# Patient Record
Sex: Female | Born: 1985 | Race: Black or African American | Hispanic: No | Marital: Single | Smoking: Never smoker
Health system: Southern US, Community
[De-identification: ages and names within clinical notes are randomized; demographics above are authoritative.]

## PROBLEM LIST (undated history)

## (undated) DIAGNOSIS — Z789 Other specified health status: Secondary | ICD-10-CM

## (undated) HISTORY — PX: WISDOM TOOTH EXTRACTION: SHX21

---
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
## 2010-04-22 ENCOUNTER — Inpatient Hospital Stay (HOSPITAL_COMMUNITY)
Admission: AD | Admit: 2010-04-22 | Discharge: 2010-04-22 | Payer: Self-pay | Source: Home / Self Care | Attending: Obstetrics and Gynecology | Admitting: Obstetrics and Gynecology

## 2010-04-27 LAB — GC/CHLAMYDIA PROBE AMP, GENITAL
Chlamydia, DNA Probe: NEGATIVE
GC Probe Amp, Genital: NEGATIVE

## 2010-04-27 LAB — WET PREP, GENITAL
Trich, Wet Prep: NONE SEEN
Yeast Wet Prep HPF POC: NONE SEEN

## 2010-04-27 LAB — URINALYSIS, ROUTINE W REFLEX MICROSCOPIC
Bilirubin Urine: NEGATIVE
Hgb urine dipstick: NEGATIVE
Ketones, ur: 15 mg/dL — AB
Nitrite: NEGATIVE
Protein, ur: NEGATIVE mg/dL
Specific Gravity, Urine: 1.015 (ref 1.005–1.030)
Urine Glucose, Fasting: NEGATIVE mg/dL
Urobilinogen, UA: 0.2 mg/dL (ref 0.0–1.0)
pH: 6 (ref 5.0–8.0)

## 2010-04-27 LAB — POCT PREGNANCY, URINE: Preg Test, Ur: POSITIVE

## 2010-04-27 LAB — HEMOGLOBIN AND HEMATOCRIT, BLOOD
HCT: 35.4 % — ABNORMAL LOW (ref 36.0–46.0)
Hemoglobin: 12.1 g/dL (ref 12.0–15.0)

## 2010-05-11 ENCOUNTER — Inpatient Hospital Stay (HOSPITAL_COMMUNITY)
Admission: AD | Admit: 2010-05-11 | Discharge: 2010-05-11 | Payer: Self-pay | Source: Home / Self Care | Attending: Obstetrics and Gynecology | Admitting: Obstetrics and Gynecology

## 2010-05-11 LAB — URINALYSIS, ROUTINE W REFLEX MICROSCOPIC
Bilirubin Urine: NEGATIVE
Hgb urine dipstick: NEGATIVE
Ketones, ur: 15 mg/dL — AB
Leukocytes, UA: NEGATIVE
Nitrite: POSITIVE — AB
Protein, ur: NEGATIVE mg/dL
Specific Gravity, Urine: 1.02 (ref 1.005–1.030)
Urine Glucose, Fasting: NEGATIVE mg/dL
Urobilinogen, UA: 0.2 mg/dL (ref 0.0–1.0)
pH: 6.5 (ref 5.0–8.0)

## 2010-05-11 LAB — URINE MICROSCOPIC-ADD ON

## 2010-09-22 ENCOUNTER — Inpatient Hospital Stay (HOSPITAL_COMMUNITY)
Admission: AD | Admit: 2010-09-22 | Discharge: 2010-09-27 | DRG: 775 | Disposition: A | Discharge status: Home / Self Care | Payer: Medicaid Other | Source: Ambulatory Visit | Attending: Obstetrics and Gynecology | Admitting: Obstetrics and Gynecology

## 2010-09-22 ENCOUNTER — Inpatient Hospital Stay (HOSPITAL_COMMUNITY): Payer: Medicaid Other

## 2010-09-22 LAB — URINALYSIS, ROUTINE W REFLEX MICROSCOPIC
Bilirubin Urine: NEGATIVE
Glucose, UA: NEGATIVE mg/dL
Ketones, ur: NEGATIVE mg/dL
Nitrite: NEGATIVE
Protein, ur: NEGATIVE mg/dL
Specific Gravity, Urine: <1.005 — ABNORMAL LOW (ref 1.005–1.030)
Urobilinogen, UA: 0.2 mg/dL (ref 0.0–1.0)
pH: 6.5 (ref 5.0–8.0)

## 2010-09-22 LAB — WET PREP, GENITAL
Clue Cells Wet Prep HPF POC: NONE SEEN
Trich, Wet Prep: NONE SEEN
Yeast Wet Prep HPF POC: NONE SEEN

## 2010-09-22 LAB — URINE MICROSCOPIC-ADD ON

## 2010-09-22 LAB — CBC
HCT: 30.4 % — ABNORMAL LOW (ref 36.0–46.0)
Hemoglobin: 10.2 g/dL — ABNORMAL LOW (ref 12.0–15.0)
MCH: 30 pg (ref 26.0–34.0)
MCHC: 33.6 g/dL (ref 30.0–36.0)
MCV: 89.4 fL (ref 78.0–100.0)
Platelets: 223 10*3/uL (ref 150–400)
RBC: 3.4 MIL/uL — ABNORMAL LOW (ref 3.87–5.11)
RDW: 13 % (ref 11.5–15.5)
WBC: 19.8 10*3/uL — ABNORMAL HIGH (ref 4.0–10.5)

## 2010-09-23 LAB — URINE CULTURE
Colony Count: 40000
Culture  Setup Time: 201206130119

## 2010-09-23 LAB — GC/CHLAMYDIA PROBE AMP, GENITAL
Chlamydia, DNA Probe: NEGATIVE
GC Probe Amp, Genital: NEGATIVE

## 2010-09-24 LAB — STREP B DNA PROBE: Strep Group B Ag: NEGATIVE

## 2010-09-25 ENCOUNTER — Other Ambulatory Visit: Payer: Self-pay | Admitting: Obstetrics and Gynecology

## 2010-09-25 LAB — CBC
HCT: 32.4 % — ABNORMAL LOW (ref 36.0–46.0)
Hemoglobin: 10.8 g/dL — ABNORMAL LOW (ref 12.0–15.0)
MCH: 29.7 pg (ref 26.0–34.0)
MCHC: 33.3 g/dL (ref 30.0–36.0)
MCV: 89 fL (ref 78.0–100.0)
Platelets: 227 10*3/uL (ref 150–400)
RBC: 3.64 MIL/uL — ABNORMAL LOW (ref 3.87–5.11)
RDW: 12.9 % (ref 11.5–15.5)
WBC: 27.7 10*3/uL — ABNORMAL HIGH (ref 4.0–10.5)

## 2010-09-26 LAB — CBC
HCT: 31.8 % — ABNORMAL LOW (ref 36.0–46.0)
Hemoglobin: 10.8 g/dL — ABNORMAL LOW (ref 12.0–15.0)
MCH: 30.3 pg (ref 26.0–34.0)
MCHC: 34 g/dL (ref 30.0–36.0)
MCV: 89.3 fL (ref 78.0–100.0)
Platelets: 241 10*3/uL (ref 150–400)
RBC: 3.56 MIL/uL — ABNORMAL LOW (ref 3.87–5.11)
RDW: 13 % (ref 11.5–15.5)
WBC: 21 10*3/uL — ABNORMAL HIGH (ref 4.0–10.5)

## 2011-07-14 ENCOUNTER — Ambulatory Visit: Payer: Self-pay | Admitting: Obstetrics and Gynecology

## 2011-07-19 ENCOUNTER — Other Ambulatory Visit: Payer: Self-pay | Admitting: Obstetrics and Gynecology

## 2011-07-19 ENCOUNTER — Ambulatory Visit (INDEPENDENT_AMBULATORY_CARE_PROVIDER_SITE_OTHER): Payer: Medicaid Other | Admitting: Obstetrics and Gynecology

## 2011-07-19 DIAGNOSIS — Z0000 Encounter for general adult medical examination without abnormal findings: Secondary | ICD-10-CM

## 2011-07-19 DIAGNOSIS — N926 Irregular menstruation, unspecified: Secondary | ICD-10-CM

## 2011-07-20 LAB — GC/CHLAMYDIA PROBE AMP, GENITAL
Chlamydia, DNA Probe: NEGATIVE
GC Probe Amp, Genital: NEGATIVE

## 2011-10-25 IMAGING — US US OB COMP +14 WK
1 series · 12 of 28 positions shown · non-contrast
Comparison: none

[Series 1: us ob comp +14 wk · 12 of 62 slices shown]
[im 3/62]
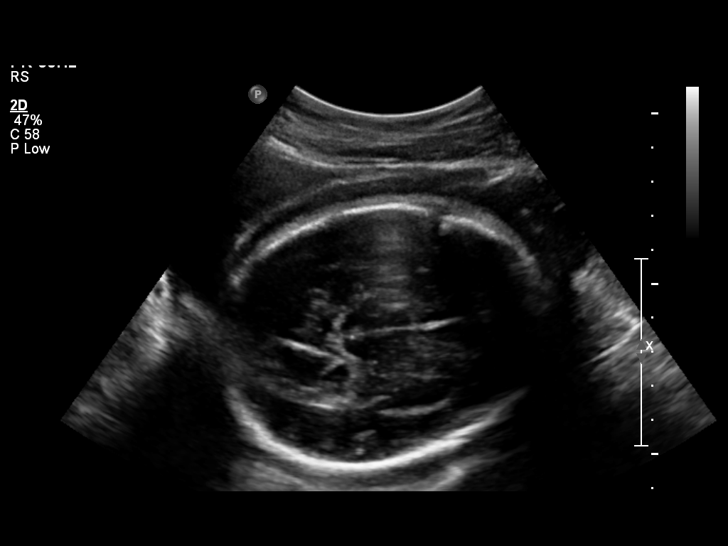
[im 7/62]
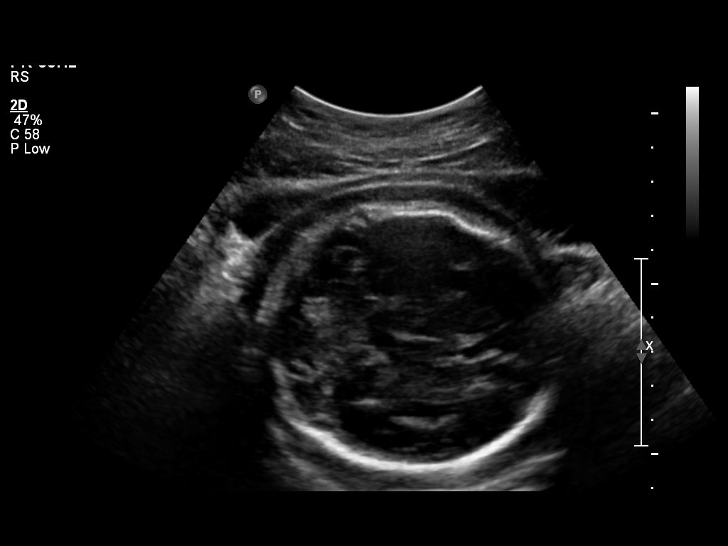
[im 12/62]
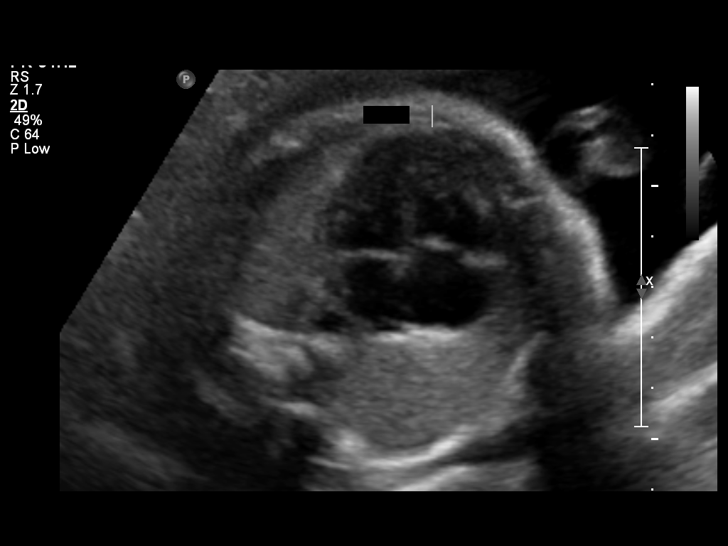
[im 19/62]
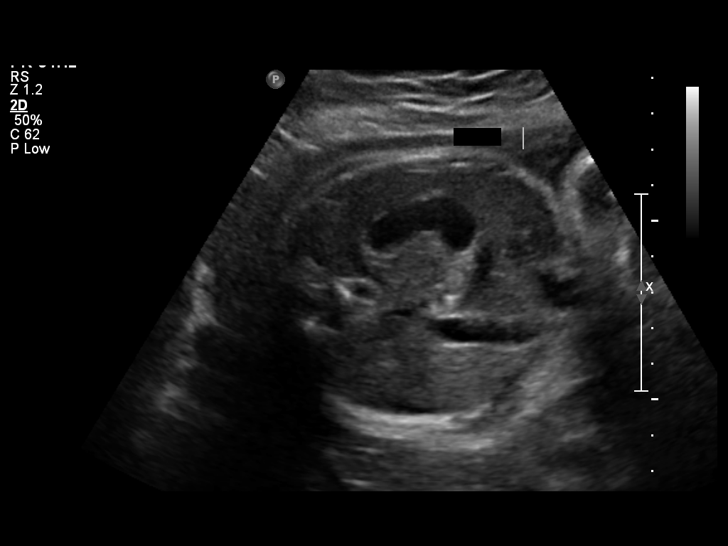
[im 23/62]
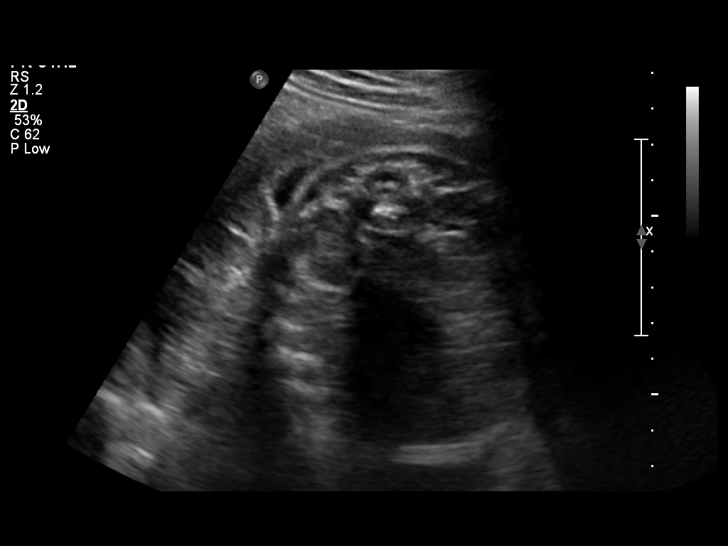
[im 28/62]
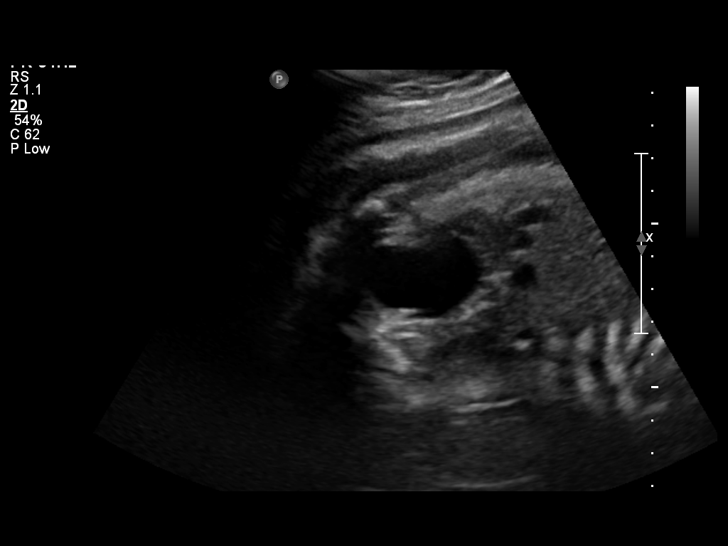
[im 34/62]
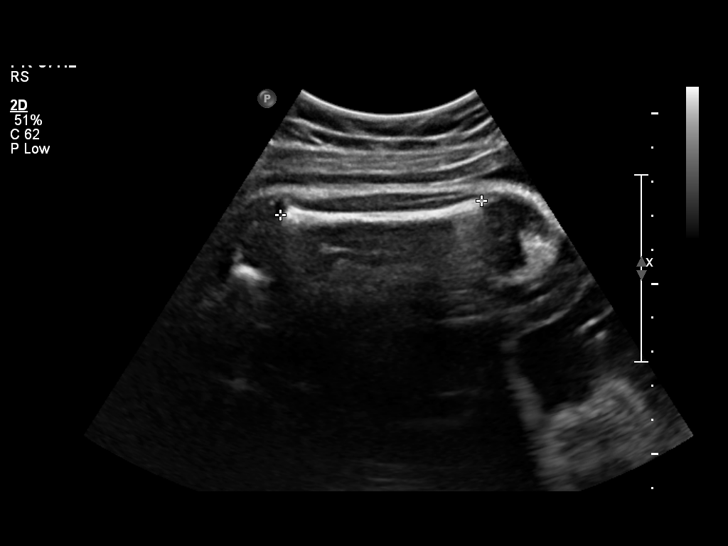
[im 39/62]
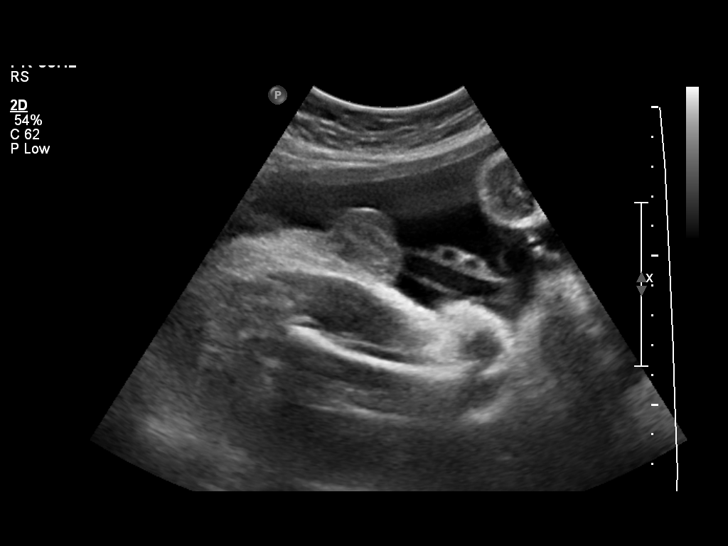
[im 43/62]
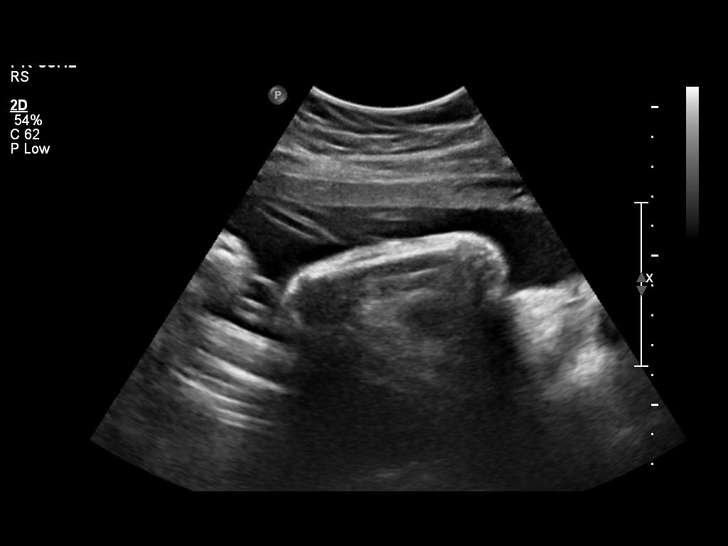
[im 50/62]
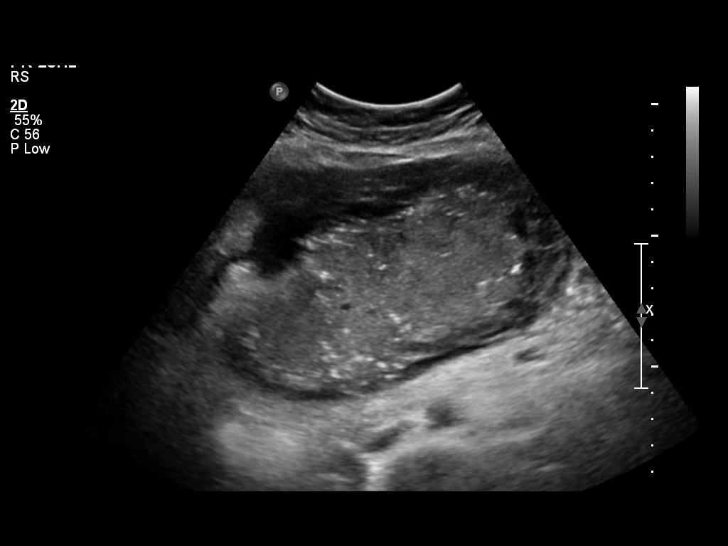
[im 55/62]
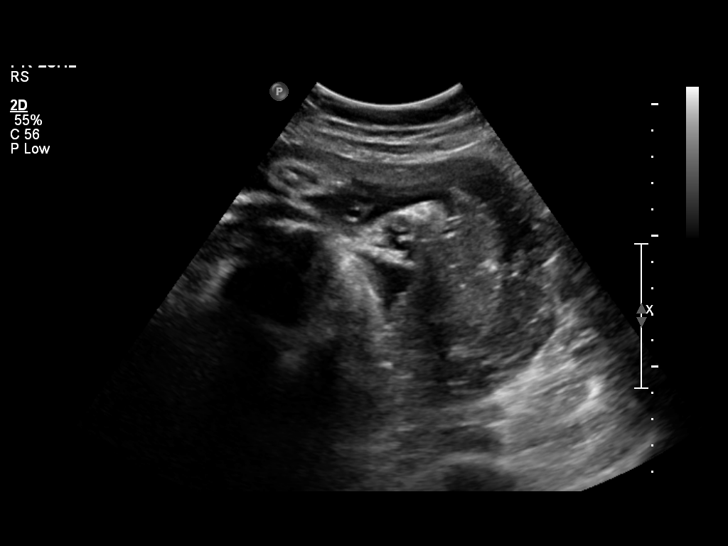
[im 59/62]
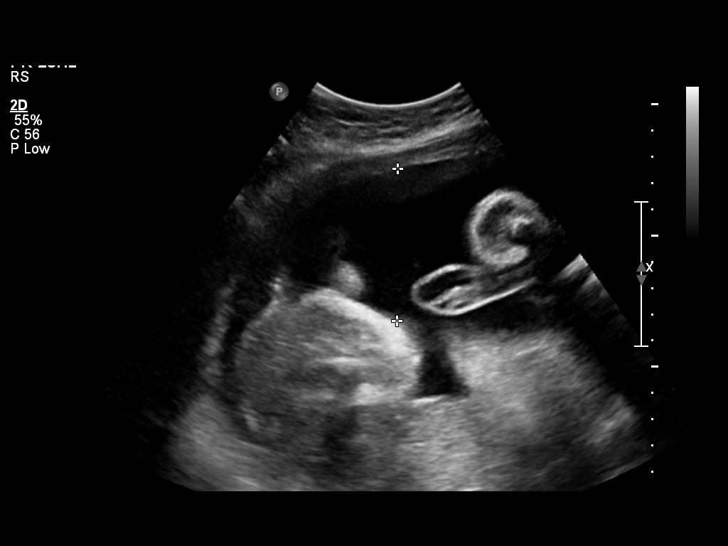

[12 of 28 positions shown; findings below may reference images not displayed]

OBSTETRICS REPORT
                      (Signed Final 09/22/2010 [DATE])

           KUNDE

Procedures

 US OB COMP + 14 WK                                    76805.1
Indications

 Vaginal bleeding, unknown etiology
Fetal Evaluation

 Fetal Heart Rate:  142                          bpm
 Cardiac Activity:  Observed
 Presentation:      Cephalic
 Placenta:          Posterior, above cervical
                    os
 P. Cord            Not well visualized
 Insertion:

 Amniotic Fluid
 AFI FV:      Subjectively within normal limits
 AFI Sum:     21.09   cm       81  %Tile     Larg Pckt:     5.8  cm
 RUQ:   5.77    cm   RLQ:    5.72   cm    LUQ:   5.8     cm   LLQ:    3.8    cm
Biometry

 BPD:     73.6  mm     G. Age:  29w 4d                CI:        72.38   70 - 86
                                                      FL/HC:      21.4   19.1 -

 HC:     275.2  mm     G. Age:  30w 0d      < 3  %    HC/AC:      1.00   0.96 -

 AC:       274  mm     G. Age:  31w 4d       45  %    FL/BPD:     80.0   71 - 87
 FL:      58.9  mm     G. Age:  30w 5d       18  %    FL/AC:      21.5   20 - 24

 Est. FW:    4116  gm    3 lb 11 oz      43  %
Gestational Age

 Clinical EDD:  31w 4d                                        EDD:   11/20/10
 U/S Today:     30w 3d                                        EDD:   11/28/10
 Best:          31w 4d     Det. By:  Clinical EDD             EDD:   11/20/10
Anatomy
 Cranium:           Appears normal      Aortic Arch:       Basic anatomy
                                                           exam per order
 Fetal Cavum:       Appears normal      Ductal Arch:       Basic anatomy
                                                           exam per order
 Ventricles:        Appears normal      Diaphragm:         Basic anatomy
                                                           exam per order
 Choroid Plexus:    Not well            Stomach:           Appears
                    visualized                             normal, left
                                                           sided
 Cerebellum:        Not well            Abdomen:           Appears normal
                    visualized
 Posterior Fossa:   Not well            Abdominal Wall:    Not well
                    visualized                             visualized
 Nuchal Fold:       Not applicable      Cord Vessels:      Appears normal
                    (>20 wks GA)                           (3 vessel cord)
 Face:              Lips and orbits     Kidneys:           Appear normal
                    appear normal
 Heart:             Appears normal      Bladder:           Appears normal
                    (4 chamber &
                    axis)
 RVOT:              Appears normal      Spine:             Not well
                                                           visualized
 LVOT:              Appears normal      Limbs:             Four extremities
                                                           visualized
                                                           (basic anatomy
                                                           exam)

 Other:     Fetus appears to be a male. Technically difficult due to
            advanced GA and fetal position.
Cervix Uterus Adnexa

 Cervix:       Not evaluated.
 Uterus:       No abnormality visualized.
 Cul De Sac:   No free fluid seen.
 Left Ovary:    Not visualized.
 Right Ovary:   Not visualized.
 Adnexa:     No abnormality visualized.
Impression

 Single living intrauterine pregnancy in cephalic presentation.
 The estimated gestational age is 31w 4d based on Clinical
 EDD. Placenta is well above internal os, and has a normal
 appearance. No fetal anomalies are identified. Anatomic
 survey is limited by gestational age.

## 2011-12-29 ENCOUNTER — Telehealth: Payer: Self-pay | Admitting: Obstetrics and Gynecology

## 2011-12-29 NOTE — Telephone Encounter (Signed)
TRIAGE/BLEEDING °

## 2011-12-29 NOTE — Telephone Encounter (Signed)
TC TO PT REGARDING MESSAGE. PT WANT AN APPT TO EVAL PT IUD. PT STATES THAT SHE IS STARTING TO BLEED AGAIN WITH THE IUD. PT STATES THAT [facility] GAVE PT SOME MED TO HELP STOP BLEEDING AND PT FEELS LIKE SHE NEED THE MED AGAIN. PT WILL COME IN ON Friday TO SEE EP. PT VOICED UNDERSTANDING.

## 2011-12-31 ENCOUNTER — Encounter: Payer: Self-pay | Admitting: Obstetrics and Gynecology

## 2011-12-31 ENCOUNTER — Ambulatory Visit (INDEPENDENT_AMBULATORY_CARE_PROVIDER_SITE_OTHER): Payer: Medicaid Other | Admitting: Obstetrics and Gynecology

## 2011-12-31 VITALS — BP 120/70 | HR 80 | Wt 179.0 lb

## 2011-12-31 DIAGNOSIS — Z975 Presence of (intrauterine) contraceptive device: Secondary | ICD-10-CM

## 2011-12-31 DIAGNOSIS — N926 Irregular menstruation, unspecified: Secondary | ICD-10-CM

## 2011-12-31 DIAGNOSIS — R8281 Pyuria: Secondary | ICD-10-CM

## 2011-12-31 DIAGNOSIS — R82998 Other abnormal findings in urine: Secondary | ICD-10-CM

## 2011-12-31 LAB — POCT URINALYSIS DIPSTICK
Bilirubin, UA: NEGATIVE
Glucose, UA: NEGATIVE
Ketones, UA: NEGATIVE
Nitrite, UA: NEGATIVE
Protein, UA: NEGATIVE
Spec Grav, UA: 1.01
Urobilinogen, UA: NEGATIVE
pH, UA: 5

## 2011-12-31 LAB — POCT URINE PREGNANCY: Preg Test, Ur: NEGATIVE

## 2011-12-31 MED ORDER — DOXYCYCLINE HYCLATE 100 MG PO CPEP: 100.0000 mg | ORAL_CAPSULE | Freq: Two times a day (BID) | ORAL | Status: DC

## 2011-12-31 NOTE — Addendum Note (Signed)
Addended byWinfred Leeds on: 12/31/2011 12:16 PM   Modules accepted: Orders

## 2011-12-31 NOTE — Progress Notes (Signed)
26 YO with Mirena IUD inserted December 01, 2010.  Since then has had irregular bleeding that was managed with Estrace tablets 0.5 mg daily but states symptoms have returned (last problem with this was April 2013). Symptoms resolved for one month but returned with spotting weekly and sometimes heavier requiring tampon change 4 times a day.  When bleeding is "heavy" she will have cramping. Denies urinary tract, vaginitis or bowel symptoms.  Denies dyspareunia.   GC/CT cultures were negative in April of 2013.  O: Abdomen: soft, non-tender       Pelvic: EGBUS-wnl, vagina-normal with beige discharge, cervix-string visible, uterus-normal size without tenderness      adnexae-no masses or tenderness  U/A- ph-5.0, SG 1.020, leukocytes 2+  A: Irregular Bleeding (persistent)    Mirena IUD  P: Doxycylcine 100 mg #14 bid x 7 days no refills      urine for culture     Pelvic U/S to rule out  pelvic masses      RTO-U/S as scheduled  Sutton Plake, PA-C

## 2012-01-02 LAB — URINE CULTURE: Colony Count: >=100000

## 2012-01-03 ENCOUNTER — Telehealth: Payer: Self-pay | Admitting: Obstetrics and Gynecology

## 2012-01-03 NOTE — Telephone Encounter (Signed)
Tc to pt pharmacy because pt states the rx EP prescribed for pt was not there. I called in rx for the pt and call the pt to tell her that the rx Doxycycline 100 mg #14 Take one capsule 2 times daily with no refills is at pt pharmacy now. Pt voiced understanding.

## 2012-01-04 ENCOUNTER — Telehealth: Payer: Self-pay | Admitting: Obstetrics and Gynecology

## 2012-01-04 NOTE — Telephone Encounter (Signed)
Hey EP, this pt says she started the Doxycycline this am, mouth started watering, because nauseous and vomited, wants to know if she can have something else (see message).  Katherine Lee

## 2012-01-12 ENCOUNTER — Other Ambulatory Visit: Payer: Medicaid Other

## 2012-01-12 ENCOUNTER — Encounter: Payer: Medicaid Other | Admitting: Obstetrics and Gynecology

## 2012-04-14 ENCOUNTER — Encounter (HOSPITAL_COMMUNITY): Payer: Self-pay | Admitting: Emergency Medicine

## 2012-04-14 ENCOUNTER — Emergency Department (HOSPITAL_COMMUNITY)
Admission: EM | Admit: 2012-04-14 | Discharge: 2012-04-14 | Disposition: A | Discharge status: Home / Self Care | Payer: Medicaid Other | Source: Home / Self Care

## 2012-04-14 ENCOUNTER — Other Ambulatory Visit (HOSPITAL_COMMUNITY)
Admission: RE | Admit: 2012-04-14 | Discharge: 2012-04-14 | Disposition: A | Discharge status: Home / Self Care | Payer: Medicaid Other | Source: Ambulatory Visit | Attending: Emergency Medicine | Admitting: Emergency Medicine

## 2012-04-14 DIAGNOSIS — N760 Acute vaginitis: Secondary | ICD-10-CM | POA: Insufficient documentation

## 2012-04-14 DIAGNOSIS — R1032 Left lower quadrant pain: Secondary | ICD-10-CM

## 2012-04-14 DIAGNOSIS — N730 Acute parametritis and pelvic cellulitis: Secondary | ICD-10-CM

## 2012-04-14 DIAGNOSIS — Z041 Encounter for examination and observation following transport accident: Secondary | ICD-10-CM

## 2012-04-14 DIAGNOSIS — Z113 Encounter for screening for infections with a predominantly sexual mode of transmission: Secondary | ICD-10-CM | POA: Insufficient documentation

## 2012-04-14 DIAGNOSIS — Z043 Encounter for examination and observation following other accident: Secondary | ICD-10-CM

## 2012-04-14 LAB — POCT URINALYSIS DIP (DEVICE)
Glucose, UA: NEGATIVE mg/dL
Nitrite: NEGATIVE
Specific Gravity, Urine: 1.025 (ref 1.005–1.030)
Urobilinogen, UA: 0.2 mg/dL (ref 0.0–1.0)

## 2012-04-14 LAB — POCT PREGNANCY, URINE: Preg Test, Ur: NEGATIVE

## 2012-04-14 LAB — CBC WITH DIFFERENTIAL/PLATELET
Basophils Absolute: 0 10*3/uL (ref 0.0–0.1)
Lymphocytes Relative: 35 % (ref 12–46)
Lymphs Abs: 2.8 10*3/uL (ref 0.7–4.0)
Neutro Abs: 4.9 10*3/uL (ref 1.7–7.7)
Neutrophils Relative %: 59 % (ref 43–77)
Platelets: 226 10*3/uL (ref 150–400)
RBC: 4.6 MIL/uL (ref 3.87–5.11)
WBC: 8.2 10*3/uL (ref 4.0–10.5)

## 2012-04-14 LAB — POCT I-STAT, CHEM 8
BUN: 8 mg/dL (ref 6–23)
Chloride: 103 mEq/L (ref 96–112)
HCT: 45 % (ref 36.0–46.0)
Sodium: 140 mEq/L (ref 135–145)

## 2012-04-14 MED ORDER — CEFTRIAXONE SODIUM 250 MG IJ SOLR
INTRAMUSCULAR | Status: AC
  Filled 2012-04-14: qty 250

## 2012-04-14 MED ORDER — LIDOCAINE HCL (PF) 1 % IJ SOLN
INTRAMUSCULAR | Status: AC
  Filled 2012-04-14: qty 5

## 2012-04-14 MED ORDER — AZITHROMYCIN 250 MG PO TABS
1000.0000 mg | ORAL_TABLET | Freq: Every day | ORAL | Status: DC
  Administered 2012-04-14: 1000 mg via ORAL

## 2012-04-14 MED ORDER — CEFTRIAXONE SODIUM 250 MG IJ SOLR
250.0000 mg | Freq: Once | INTRAMUSCULAR | Status: AC
  Administered 2012-04-14: 250 mg via INTRAMUSCULAR

## 2012-04-14 MED ORDER — AZITHROMYCIN 250 MG PO TABS
ORAL_TABLET | ORAL | Status: AC
  Filled 2012-04-14: qty 4
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
## 2012-04-18 MED ORDER — METRONIDAZOLE 500 MG PO TABS: 500.0000 mg | ORAL_TABLET | Freq: Two times a day (BID) | ORAL | Status: DC

## 2012-04-18 NOTE — ED Provider Notes (Signed)
Medical screening examination/treatment/procedure(s) were performed by resident physician or non-physician practitioner and as supervising physician I was immediately available for consultation/collaboration.   Stony Stegmann DOUGLAS MD.    Kalimah Capurro D Raelyn Racette, MD 04/18/12 1356 

## 2012-04-19 ENCOUNTER — Telehealth: Payer: Self-pay | Admitting: Obstetrics and Gynecology

## 2012-04-19 ENCOUNTER — Telehealth (HOSPITAL_COMMUNITY): Payer: Self-pay | Admitting: *Deleted

## 2012-04-19 NOTE — ED Provider Notes (Signed)
Medical screening examination/treatment/procedure(s) were performed by resident physician or non-physician practitioner and as supervising physician I was immediately available for consultation/collaboration.   Barkley Bruns MD.    Linna Hoff, MD 04/19/12 [phone]

## 2012-04-19 NOTE — ED Notes (Signed)
Pt. called back.  Pt. verified x 2 and given results.  Pt. told she needs Rx. of Flagyl.   Pt. instructed to no alcohol while taking this medication,  Pt. wants Rx. called to the [facility] on [address]  Rx. called to [phone]. Vassie Moselle 04/19/2012

## 2012-04-19 NOTE — ED Notes (Signed)
Gc/Chlamydia neg., Wet prep: Gardnerella pos., Candida and Trich neg.  Rx. of Flagyl given to me by Hayden Rasmussen on 1/7. I called pt. and left a message to call. Vassie Moselle 04/19/2012

## 2012-04-19 NOTE — Telephone Encounter (Signed)
Tc to pt per telephone call. Pt told to fu with gyn physician after hosp visit on 04/14/12. Pt wants to discuss the removal of IUD and another bc option. Appt sched 04/24/12 @ 8:45a with EP for eval. Pt agrees.

## 2012-04-24 ENCOUNTER — Ambulatory Visit (INDEPENDENT_AMBULATORY_CARE_PROVIDER_SITE_OTHER): Payer: Medicaid Other | Admitting: Obstetrics and Gynecology

## 2012-04-24 ENCOUNTER — Encounter: Payer: Self-pay | Admitting: Obstetrics and Gynecology

## 2012-04-24 VITALS — BP 110/72 | Temp 98.6°F | Wt 183.0 lb

## 2012-04-24 DIAGNOSIS — Z30432 Encounter for removal of intrauterine contraceptive device: Secondary | ICD-10-CM

## 2012-04-24 DIAGNOSIS — Z309 Encounter for contraceptive management, unspecified: Secondary | ICD-10-CM

## 2012-04-24 DIAGNOSIS — IMO0001 Reserved for inherently not codable concepts without codable children: Secondary | ICD-10-CM

## 2012-04-24 LAB — POCT URINE PREGNANCY: Preg Test, Ur: NEGATIVE

## 2012-04-24 MED ORDER — NORGESTIM-ETH ESTRAD TRIPHASIC 0.18/0.215/0.25 MG-35 MCG PO TABS: 1.0000 | ORAL_TABLET | Freq: Every day | ORAL | Status: DC

## 2012-04-24 MED ORDER — CYCLOBENZAPRINE HCL 5 MG PO TABS: 5.0000 mg | ORAL_TABLET | Freq: Three times a day (TID) | ORAL | Status: AC | PRN
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
## 2013-04-30 ENCOUNTER — Other Ambulatory Visit: Payer: Self-pay | Admitting: Family Medicine

## 2013-04-30 ENCOUNTER — Other Ambulatory Visit (HOSPITAL_COMMUNITY)
Admission: RE | Admit: 2013-04-30 | Discharge: 2013-04-30 | Disposition: A | Discharge status: Home / Self Care | Payer: Medicaid Other | Source: Ambulatory Visit | Attending: Family Medicine | Admitting: Family Medicine

## 2013-04-30 DIAGNOSIS — Z124 Encounter for screening for malignant neoplasm of cervix: Secondary | ICD-10-CM | POA: Insufficient documentation

## 2013-04-30 DIAGNOSIS — Z113 Encounter for screening for infections with a predominantly sexual mode of transmission: Secondary | ICD-10-CM | POA: Insufficient documentation

## 2014-02-11 ENCOUNTER — Encounter: Payer: Self-pay | Admitting: Obstetrics and Gynecology

## 2014-03-20 ENCOUNTER — Encounter (HOSPITAL_COMMUNITY): Payer: Self-pay | Admitting: *Deleted

## 2014-03-20 ENCOUNTER — Emergency Department (INDEPENDENT_AMBULATORY_CARE_PROVIDER_SITE_OTHER)
Admission: EM | Admit: 2014-03-20 | Discharge: 2014-03-20 | Disposition: A | Discharge status: Home / Self Care | Payer: Self-pay | Source: Home / Self Care | Attending: Family Medicine | Admitting: Family Medicine

## 2014-03-20 DIAGNOSIS — J0100 Acute maxillary sinusitis, unspecified: Secondary | ICD-10-CM

## 2014-03-20 MED ORDER — PREDNISONE 10 MG PO TABS: ORAL_TABLET | ORAL | Status: DC

## 2014-03-20 MED ORDER — AMOXICILLIN 875 MG PO TABS: 875.0000 mg | ORAL_TABLET | Freq: Two times a day (BID) | ORAL | Status: DC

## 2014-03-20 MED ORDER — FLUTICASONE PROPIONATE 50 MCG/ACT NA SUSP: 2.0000 | Freq: Two times a day (BID) | NASAL | Status: DC

## 2014-03-20 NOTE — ED Notes (Signed)
C/o bil earache since Monday.  R>L.  C/o pain in R face also since Mon.  C/o runny nose.  Sore throat Sat until Monday.

## 2014-03-20 NOTE — ED Provider Notes (Signed)
CSN: [id number]     Arrival date & time 03/20/14  1705 History   First MD Initiated Contact with Patient 03/20/14 1800     Chief Complaint  Patient presents with  . Otalgia   (Consider location/radiation/quality/duration/timing/severity/associated sxs/prior Treatment) HPI           28 year old female presents complaining of bilateral earache, pain in her right upper jaw, and pain in her right cheek. This initially began about 3 days ago and has gotten progressively worse. Now pain is moderately severe. She also has cough and nasal congestion. She has no history of dental infections but is wondering if she is to see a dentist, she has never had pain like this before. No fever, chills, NVD, recent travel, sick contacts. Cough is dry, nonproductive.  Past Medical History  Diagnosis Date  . IUD (intrauterine device) in place     Removed 2013   Past Surgical History  Procedure Laterality Date  . Wisdom tooth extraction     Family History  Problem Relation Age of Onset  . Hypertension Paternal Grandfather   . Arthritis Paternal Grandfather   . Stroke Other    History  Substance Use Topics  . Smoking status: Never Smoker   . Smokeless tobacco: Never Used  . Alcohol Use: No   OB History    Gravida Para Term Preterm AB TAB SAB Ectopic Multiple Living   2 1        1      Review of Systems  Constitutional: Negative for fever and chills.  HENT: Positive for congestion, ear pain, rhinorrhea, sinus pressure and sore throat.   Respiratory: Positive for cough. Negative for shortness of breath.   Cardiovascular: Negative for chest pain.  All other systems reviewed and are negative.   Allergies  Review of patient's allergies indicates no known allergies.  Home Medications   Prior to Admission medications   Medication Sig Start Date End Date Taking? Authorizing Provider  amoxicillin (AMOXIL) 875 MG tablet Take 1 tablet (875 mg total) by mouth 2 (two) times daily. 03/20/14   Graylon GoodZachary H  Carnell Beavers, PA-C  doxycycline (DORYX) 100 MG DR capsule Take 1 capsule (100 mg total) by mouth 2 (two) times daily. 12/31/11   Elmira Powell, PA-C  fluticasone (FLONASE) 50 MCG/ACT nasal spray Place 2 sprays into both nostrils 2 (two) times daily. Decrease to 2 sprays/nostril daily after 5 days 03/20/14   Graylon GoodZachary H Rayane Gallardo, PA-C  levonorgestrel (MIRENA) 20 MCG/24HR IUD 1 each by Intrauterine route once.    Historical Provider, MD  metroNIDAZOLE (FLAGYL) 500 MG tablet Take 1 tablet (500 mg total) by mouth 2 (two) times daily. For 7 days  (Call when ready) 04/18/12   Hayden Rasmussenavid Mabe, NP  Norgestimate-Ethinyl Estradiol Triphasic 0.18/0.215/0.25 MG-35 MCG tablet Take 1 tablet by mouth daily. 04/24/12   Elmira Lowell GuitarPowell, PA-C  predniSONE (DELTASONE) 10 MG tablet 4 tabs PO QD for 4 days; 3 tabs PO QD for 3 days; 2 tabs PO QD for 2 days; 1 tab PO QD for 1 day 03/20/14   Graylon GoodZachary H Ercil Cassis, PA-C   BP 136/85 mmHg  Pulse 70  Temp(Src) 98.5 F (36.9 C) (Oral)  Resp 18  SpO2 99%  LMP 03/09/2014 Physical Exam  Constitutional: She is oriented to person, place, and time. Vital signs are normal. She appears well-developed and well-nourished. No distress.  HENT:  Head: Normocephalic and atraumatic.  Right Ear: Tympanic membrane is injected, erythematous and bulging.  Left Ear: Tympanic membrane is not  injected, not erythematous and not bulging.  Nose: No mucosal edema. Right sinus exhibits maxillary sinus tenderness. Right sinus exhibits no frontal sinus tenderness. Left sinus exhibits no maxillary sinus tenderness and no frontal sinus tenderness.  Mouth/Throat: Oropharynx is clear and moist and mucous membranes are normal. Abnormal dentition (pain to palpation of the posterior upper right molars ). No dental abscesses or dental caries.  Neck: Normal range of motion. Neck supple.  Cardiovascular: Normal rate, regular rhythm, normal heart sounds and intact distal pulses.   Pulmonary/Chest: Effort normal and breath sounds normal.  No respiratory distress.  Lymphadenopathy:    She has no cervical adenopathy.  Neurological: She is alert and oriented to person, place, and time. She has normal strength. Coordination normal.  Skin: Skin is warm and dry. No rash noted. She is not diaphoretic.  Psychiatric: She has a normal mood and affect. Judgment normal.  Nursing note and vitals reviewed.   ED Course  Procedures (including critical care time) Labs Review Labs Reviewed - No data to display  Imaging Review No results found.   MDM   1. Acute maxillary sinusitis, recurrence not specified    Treat for sinusitis with amoxicillin, flonase, prednisone.  F/U if no improvement in a few days    Meds ordered this encounter  Medications  . amoxicillin (AMOXIL) 875 MG tablet    Sig: Take 1 tablet (875 mg total) by mouth 2 (two) times daily.    Dispense:  14 tablet    Refill:  0    Order Specific Question:  Supervising Provider    Answer:  Linna HoffKINDL, JAMES D [phone][[id number]]  . fluticasone (FLONASE) 50 MCG/ACT nasal spray    Sig: Place 2 sprays into both nostrils 2 (two) times daily. Decrease to 2 sprays/nostril daily after 5 days    Dispense:  16 g    Refill:  2    Order Specific Question:  Supervising Provider    Answer:  Linna HoffKINDL, JAMES D [phone][[id number]]  . predniSONE (DELTASONE) 10 MG tablet    Sig: 4 tabs PO QD for 4 days; 3 tabs PO QD for 3 days; 2 tabs PO QD for 2 days; 1 tab PO QD for 1 day    Dispense:  30 tablet    Refill:  0    Order Specific Question:  Supervising Provider    Answer:  Bradd CanaryKINDL, JAMES D [[id number]]       Graylon GoodZachary H Lewayne Pauley, PA-C 03/20/14 1850

## 2014-03-20 NOTE — Discharge Instructions (Signed)

## 2014-08-08 ENCOUNTER — Encounter (HOSPITAL_COMMUNITY): Payer: Self-pay

## 2014-08-08 ENCOUNTER — Inpatient Hospital Stay (HOSPITAL_COMMUNITY)
Admission: AD | Admit: 2014-08-08 | Discharge: 2014-08-08 | Disposition: A | Discharge status: Home / Self Care | Payer: Medicaid Other | Source: Ambulatory Visit | Attending: Obstetrics & Gynecology | Admitting: Obstetrics & Gynecology

## 2014-08-08 DIAGNOSIS — N926 Irregular menstruation, unspecified: Secondary | ICD-10-CM | POA: Insufficient documentation

## 2014-08-08 DIAGNOSIS — Z3202 Encounter for pregnancy test, result negative: Secondary | ICD-10-CM | POA: Insufficient documentation

## 2014-08-08 HISTORY — DX: Other specified health status: Z78.9

## 2014-08-08 LAB — URINALYSIS, ROUTINE W REFLEX MICROSCOPIC
Bilirubin Urine: NEGATIVE
GLUCOSE, UA: NEGATIVE mg/dL
Ketones, ur: NEGATIVE mg/dL
LEUKOCYTES UA: NEGATIVE
NITRITE: NEGATIVE
PROTEIN: NEGATIVE mg/dL
SPECIFIC GRAVITY, URINE: 1.025 (ref 1.005–1.030)
UROBILINOGEN UA: 0.2 mg/dL (ref 0.0–1.0)
pH: 7 (ref 5.0–8.0)

## 2014-08-08 LAB — POCT PREGNANCY, URINE: Preg Test, Ur: NEGATIVE

## 2014-08-08 LAB — URINE MICROSCOPIC-ADD ON

## 2014-08-08 LAB — WET PREP, GENITAL
Trich, Wet Prep: NONE SEEN
YEAST WET PREP: NONE SEEN

## 2014-08-08 LAB — CBC
HEMATOCRIT: 35.7 % — AB (ref 36.0–46.0)
HEMOGLOBIN: 11.9 g/dL — AB (ref 12.0–15.0)
MCH: 29.9 pg (ref 26.0–34.0)
MCHC: 33.3 g/dL (ref 30.0–36.0)
MCV: 89.7 fL (ref 78.0–100.0)
Platelets: 213 10*3/uL (ref 150–400)
RBC: 3.98 MIL/uL (ref 3.87–5.11)
RDW: 14.8 % (ref 11.5–15.5)
WBC: 8.4 10*3/uL (ref 4.0–10.5)

## 2014-08-08 MED ORDER — NORGESTIMATE-ETH ESTRADIOL 0.25-35 MG-MCG PO TABS: ORAL_TABLET | ORAL | Status: DC

## 2014-08-08 NOTE — MAU Provider Note (Signed)
Chief Complaint: Vaginal Bleeding   First Provider Initiated Contact with Patient 08/08/14 1125      SUBJECTIVE HPI: Katherine Lee is a 29 y.o. G2P0011 at Unknown by LMP who presents to maternity admissions reporting onset of menses on 07/16/14, and daily bleeding since then, some days heavy and some days light but bleeding daily x 3.5 weeks.  She had Mirena IUD x 1 year after birth of her last child and did not like irregular bleeding with the device. It was removed in January by [facility].  She reports she does She denies vaginal itching/burning, urinary symptoms, h/a, dizziness, n/v, or fever/chills.     Vaginal Bleeding The patient's primary symptoms include vaginal bleeding. This is a new problem. The current episode started 1 to 4 weeks ago. The problem occurs daily. The problem has been gradually worsening. The pain is mild. She is not pregnant. Associated symptoms include abdominal pain. Pertinent negatives include no back pain, constipation, dysuria, fever, frequency, headaches, nausea, urgency or vomiting. The vaginal bleeding is typical of menses. She has not been passing clots. She has not been passing tissue. Nothing aggravates the symptoms. She is sexually active. No, her partner does not have an STD. She uses nothing for contraception. Her menstrual history has been regular.    Past Medical History  Diagnosis Date  . Medical history non-contributory    Past Surgical History  Procedure Laterality Date  . Wisdom tooth extraction     History   Social History  . Marital Status: Single    Spouse Name: N/A  . Number of Children: N/A  . Years of Education: N/A   Occupational History  . Not on file.   Social History Main Topics  . Smoking status: Never Smoker   . Smokeless tobacco: Never Used  . Alcohol Use: No  . Drug Use: No  . Sexual Activity: Yes    Birth Control/ Protection: Condom, None   Other Topics Concern  . Not on file   Social History Narrative   No  current facility-administered medications on file prior to encounter.   Current Outpatient Prescriptions on File Prior to Encounter  Medication Sig Dispense Refill  . amoxicillin (AMOXIL) 875 MG tablet Take 1 tablet (875 mg total) by mouth 2 (two) times daily. (Patient not taking: Reported on 08/08/2014) 14 tablet 0  . doxycycline (DORYX) 100 MG DR capsule Take 1 capsule (100 mg total) by mouth 2 (two) times daily. (Patient not taking: Reported on 08/08/2014) 14 capsule 0  . fluticasone (FLONASE) 50 MCG/ACT nasal spray Place 2 sprays into both nostrils 2 (two) times daily. Decrease to 2 sprays/nostril daily after 5 days (Patient not taking: Reported on 08/08/2014) 16 g 2  . metroNIDAZOLE (FLAGYL) 500 MG tablet Take 1 tablet (500 mg total) by mouth 2 (two) times daily. For 7 days  (Call when ready) (Patient not taking: Reported on 08/08/2014) 14 tablet 0  . Norgestimate-Ethinyl Estradiol Triphasic 0.18/0.215/0.25 MG-35 MCG tablet Take 1 tablet by mouth daily. (Patient not taking: Reported on 08/08/2014) 1 Package 11  . predniSONE (DELTASONE) 10 MG tablet 4 tabs PO QD for 4 days; 3 tabs PO QD for 3 days; 2 tabs PO QD for 2 days; 1 tab PO QD for 1 day (Patient not taking: Reported on 08/08/2014) 30 tablet 0   No Known Allergies  Review of Systems  Constitutional: Negative for fever.  Respiratory: Negative for cough and shortness of breath.   Gastrointestinal: Positive for abdominal pain. Negative for nausea,  vomiting and constipation.  Genitourinary: Positive for vaginal bleeding. Negative for dysuria, urgency and frequency.  Musculoskeletal: Negative for back pain.  Neurological: Negative for dizziness and headaches.    OBJECTIVE Blood pressure 135/75, pulse 66, temperature 98 F (36.7 C), temperature source Oral, resp. rate 16, height 5\' 7"  (1.702 m), weight 87.635 kg (193 lb 3.2 oz), last menstrual period 07/16/2014. GENERAL: Well-developed, well-nourished female in no acute distress.  EYES:  normal sclera/conjunctiva; no lid-lag HENT: Atraumatic, normocephalic HEART: normal rate RESP: normal effort ABDOMEN: Soft, non-tender MUSCULOSKELETAL: Normal ROM EXTREMITIES: Nontender, no edema NEURO/PSYCH: Alert and oriented, appropriate affect  PELVIC EXAM: Cervix pink, visually closed, without lesion, scant white creamy discharge, vaginal walls and external genitalia normal Bimanual exam: Cervix 0/long/high, firm, anterior, neg CMT, uterus nontender, nonenlarged, adnexa without tenderness, enlargement, or mass    LAB RESULTS Results for orders placed or performed during the hospital encounter of 08/08/14 (from the past 24 hour(s))  Urinalysis, Routine w reflex microscopic     Status: Abnormal   Collection Time: 08/08/14 11:05 AM  Result Value Ref Range   Color, Urine RED (A) YELLOW   APPearance HAZY (A) CLEAR   Specific Gravity, Urine 1.025 1.005 - 1.030   pH 7.0 5.0 - 8.0   Glucose, UA NEGATIVE NEGATIVE mg/dL   Hgb urine dipstick LARGE (A) NEGATIVE   Bilirubin Urine NEGATIVE NEGATIVE   Ketones, ur NEGATIVE NEGATIVE mg/dL   Protein, ur NEGATIVE NEGATIVE mg/dL   Urobilinogen, UA 0.2 0.0 - 1.0 mg/dL   Nitrite NEGATIVE NEGATIVE   Leukocytes, UA NEGATIVE NEGATIVE  Urine microscopic-add on     Status: None   Collection Time: 08/08/14 11:05 AM  Result Value Ref Range   Squamous Epithelial / LPF RARE RARE   WBC, UA 0-2 <3 WBC/hpf   RBC / HPF TOO NUMEROUS TO COUNT <3 RBC/hpf  Pregnancy, urine POC     Status: None   Collection Time: 08/08/14 11:08 AM  Result Value Ref Range   Preg Test, Ur NEGATIVE NEGATIVE  CBC     Status: Abnormal   Collection Time: 08/08/14 11:44 AM  Result Value Ref Range   WBC 8.4 4.0 - 10.5 K/uL   RBC 3.98 3.87 - 5.11 MIL/uL   Hemoglobin 11.9 (L) 12.0 - 15.0 g/dL   HCT 40.935.7 (L) 81.136.0 - 91.446.0 %   MCV 89.7 78.0 - 100.0 fL   MCH 29.9 26.0 - 34.0 pg   MCHC 33.3 30.0 - 36.0 g/dL   RDW 78.214.8 95.611.5 - 21.315.5 %   Platelets 213 150 - 400 K/uL  Wet prep, genital      Status: Abnormal   Collection Time: 08/08/14 12:00 PM  Result Value Ref Range   Yeast Wet Prep HPF POC NONE SEEN NONE SEEN   Trich, Wet Prep NONE SEEN NONE SEEN   Clue Cells Wet Prep HPF POC FEW (A) NONE SEEN   WBC, Wet Prep HPF POC FEW (A) NONE SEEN    ASSESSMENT 1. Irregular menses     PLAN Discharge home OCP taper, Sprintec 2 tabs daily x 3 days, then 1 tab daily F/U in WOC for irregular bleeding Messages sent to clinic  Follow-up Information    Follow [facility] with [facility].   Specialty:  Obstetrics and Gynecology   Why:  The clinic will call you with appointment.   Contact information:   [address] [place] [id number] [phone]      Follow [facility] with [facility].   Why:  As needed for emergencies   Contact information:   [address] [id number] mc [place] [address] [phone]      Sharen Counter Certified Nurse-Midwife 08/08/2014  1:02 PM

## 2014-08-08 NOTE — Discharge Instructions (Signed)
Abnormal Uterine Bleeding Abnormal uterine bleeding can affect women at various stages in life, including teenagers, women in their reproductive years, pregnant women, and women who have reached menopause. Several kinds of uterine bleeding are considered abnormal, including:  Bleeding or spotting between periods.   Bleeding after sexual intercourse.   Bleeding that is heavier or more than normal.   Periods that last longer than usual.  Bleeding after menopause.  Many cases of abnormal uterine bleeding are minor and simple to treat, while others are more serious. Any type of abnormal bleeding should be evaluated by your health care provider. Treatment will depend on the cause of the bleeding. HOME CARE INSTRUCTIONS Monitor your condition for any changes. The following actions may help to alleviate any discomfort you are experiencing:  Avoid the use of tampons and douches as directed by your health care provider.  Change your pads frequently. You should get regular pelvic exams and Pap tests. Keep all follow-up appointments for diagnostic tests as directed by your health care provider.  SEEK MEDICAL CARE IF:   Your bleeding lasts more than 1 week.   You feel dizzy at times.  SEEK IMMEDIATE MEDICAL CARE IF:   You pass out.   You are changing pads every 15 to 30 minutes.   You have abdominal pain.  You have a fever.   You become sweaty or weak.   You are passing large blood clots from the vagina.   You start to feel nauseous and vomit. MAKE SURE YOU:   Understand these instructions.  Will watch your condition.  Will get help right away if you are not doing well or get worse. Document Released: 03/29/2005 Document Revised: 04/03/2013 Document Reviewed: 10/26/2012 ExitCare Patient Information 2015 [employer]. This information is not intended to replace advice given to you by your health care provider. Make sure you discuss any questions you have with your  health care provider.  

## 2014-08-08 NOTE — MAU Note (Signed)
Patient presents with c/o vaginal bleeding and passing blood clots onset 07/16/14 (which was her normal time to start her period) has been bleeding from light to heavy ever since, abdominal cramping.

## 2014-08-09 LAB — GC/CHLAMYDIA PROBE AMP (~~LOC~~) NOT AT ARMC
Chlamydia: NEGATIVE
NEISSERIA GONORRHEA: NEGATIVE

## 2014-08-09 LAB — HIV ANTIBODY (ROUTINE TESTING W REFLEX): HIV SCREEN 4TH GENERATION: NONREACTIVE

## 2014-09-06 ENCOUNTER — Ambulatory Visit: Payer: Self-pay | Admitting: Obstetrics and Gynecology

## 2017-07-18 ENCOUNTER — Encounter (HOSPITAL_COMMUNITY): Payer: Self-pay | Admitting: Obstetrics and Gynecology

## 2018-05-19 DIAGNOSIS — A6004 Herpesviral vulvovaginitis: Secondary | ICD-10-CM | POA: Insufficient documentation

## 2019-04-13 NOTE — L&D Delivery Note (Signed)
Delivery Note   Patient Name: Katherine Lee DOB: November 25, 1985 MRN: [id number]  Date of admission: 03/30/2020 Delivering MD: Dale   Date of delivery:03/30/20 Type of delivery: SVD  Newborn Data: Live born female  Birth Weight:   APGAR: 8, 9  Newborn Delivery   Birth date/time: 03/30/2020 06:01:00 Delivery type: Vaginal, Spontaneous     [name], 34 y.o., @ [redacted]w[redacted]d,  [id number], who was admitted for SROM with labor. I was called to the room when she progressed 2+ station in the second stage of labor @ 36.6.  She pushed for 2/min.  She delivered a viable infant, cephalic and restituted to the OA position over an intact perineum.  A nuchal cord   was identified and reduced and well as a right compound hand, and a right foot cord. The baby was placed on maternal abdomen while initial step of NRP were perfmored (Dry, Stimulated, and warmed). Hat placed on baby for thermoregulation. Delayed cord clamping was performed for 2 minutes.  Cord double clamped and cut.  Cord cut by 2. Apgar scores were 8 and 9. Prophylactic Pitocin was started in the third stage of labor for active management. The placenta delivered spontaneously, shultz, with a 3 vessel cord and was sent to LD.  Inspection revealed none. An examination of the vaginal vault and cervix was free from lacerations. The uterus was firm, bleeding stable.   Placenta and umbilical artery blood gas were not sent.  There were no complications during the procedure.  Mom and baby skin to skin following delivery. Left in stable condition.  Maternal Info: Anesthesia: Natural Episiotomy: NO Lacerations:  NO Suture Repair: NO Est. Blood Loss (mL):   Newborn Info:  Baby Sex: female Circumcision: in pt desired  APGAR (1 MIN):   APGAR (5 MINS):   APGAR (10 MINS):     Mom to postpartum.  Baby to Couplet care / Skin to Skin.  DR Richardson Dopp updated.   [place], [place], NP-C 03/30/20 6:57 AM

## 2019-09-27 LAB — OB RESULTS CONSOLE HEPATITIS B SURFACE ANTIGEN: Hepatitis B Surface Ag: NEGATIVE

## 2019-09-27 LAB — HEPATITIS C ANTIBODY: HCV Ab: NEGATIVE

## 2019-09-27 LAB — OB RESULTS CONSOLE RUBELLA ANTIBODY, IGM: Rubella: IMMUNE

## 2019-10-02 DIAGNOSIS — Z3490 Encounter for supervision of normal pregnancy, unspecified, unspecified trimester: Secondary | ICD-10-CM | POA: Insufficient documentation

## 2019-10-23 DIAGNOSIS — N12 Tubulo-interstitial nephritis, not specified as acute or chronic: Secondary | ICD-10-CM | POA: Insufficient documentation

## 2019-10-23 DIAGNOSIS — A6000 Herpesviral infection of urogenital system, unspecified: Secondary | ICD-10-CM | POA: Insufficient documentation

## 2020-01-29 LAB — OB RESULTS CONSOLE HIV ANTIBODY (ROUTINE TESTING)
HIV: NONREACTIVE
HIV: NONREACTIVE
HIV: NONREACTIVE

## 2020-03-25 LAB — OB RESULTS CONSOLE GBS: GBS: NEGATIVE

## 2020-03-30 ENCOUNTER — Inpatient Hospital Stay (HOSPITAL_COMMUNITY)
Admission: AD | Admit: 2020-03-30 | Discharge: 2020-04-01 | DRG: 806 | Disposition: A | Discharge status: Home / Self Care | Payer: Medicaid Other | Attending: Obstetrics & Gynecology | Admitting: Obstetrics & Gynecology

## 2020-03-30 ENCOUNTER — Other Ambulatory Visit: Payer: Self-pay

## 2020-03-30 ENCOUNTER — Encounter (HOSPITAL_COMMUNITY): Payer: Self-pay | Admitting: Internal Medicine

## 2020-03-30 DIAGNOSIS — A6000 Herpesviral infection of urogenital system, unspecified: Secondary | ICD-10-CM | POA: Diagnosis present

## 2020-03-30 DIAGNOSIS — O9832 Other infections with a predominantly sexual mode of transmission complicating childbirth: Secondary | ICD-10-CM | POA: Diagnosis present

## 2020-03-30 DIAGNOSIS — Z20822 Contact with and (suspected) exposure to covid-19: Secondary | ICD-10-CM | POA: Diagnosis present

## 2020-03-30 DIAGNOSIS — Z3A36 36 weeks gestation of pregnancy: Secondary | ICD-10-CM | POA: Diagnosis not present

## 2020-03-30 LAB — CBC
HCT: 37.2 % (ref 36.0–46.0)
Hemoglobin: 12.3 g/dL (ref 12.0–15.0)
MCH: 30.3 pg (ref 26.0–34.0)
MCHC: 33.1 g/dL (ref 30.0–36.0)
MCV: 91.6 fL (ref 80.0–100.0)
Platelets: 223 10*3/uL (ref 150–400)
RBC: 4.06 MIL/uL (ref 3.87–5.11)
RDW: 13.7 % (ref 11.5–15.5)
WBC: 15.1 10*3/uL — ABNORMAL HIGH (ref 4.0–10.5)
nRBC: 0 % (ref 0.0–0.2)

## 2020-03-30 LAB — RESP PANEL BY RT-PCR (FLU A&B, COVID) ARPGX2
Influenza A by PCR: NEGATIVE
Influenza B by PCR: NEGATIVE
SARS Coronavirus 2 by RT PCR: NEGATIVE

## 2020-03-30 LAB — TYPE AND SCREEN
ABO/RH(D): B POS
Antibody Screen: NEGATIVE

## 2020-03-30 LAB — POCT FERN TEST: POCT Fern Test: POSITIVE

## 2020-03-30 LAB — RPR
RPR Ser Ql: REACTIVE — AB
RPR Titer: 1:1 {titer}

## 2020-03-30 MED ORDER — FENTANYL-BUPIVACAINE-NACL 0.5-0.125-0.9 MG/250ML-% EP SOLN: 12.0000 mL/h | EPIDURAL | Status: DC | PRN

## 2020-03-30 MED ORDER — LIDOCAINE HCL (PF) 1 % IJ SOLN: 30.0000 mL | INTRAMUSCULAR | Status: DC | PRN

## 2020-03-30 MED ORDER — ACETAMINOPHEN 325 MG PO TABS
650.0000 mg | ORAL_TABLET | ORAL | Status: DC | PRN
  Administered 2020-03-30 – 2020-03-31 (×7): 650 mg via ORAL
  Filled 2020-03-30 (×7): qty 2

## 2020-03-30 MED ORDER — ZOLPIDEM TARTRATE 5 MG PO TABS: 5.0000 mg | ORAL_TABLET | Freq: Every evening | ORAL | Status: DC | PRN

## 2020-03-30 MED ORDER — EPHEDRINE 5 MG/ML INJ: 10.0000 mg | INTRAVENOUS | Status: DC | PRN

## 2020-03-30 MED ORDER — DIPHENHYDRAMINE HCL 50 MG/ML IJ SOLN: 12.5000 mg | INTRAMUSCULAR | Status: DC | PRN

## 2020-03-30 MED ORDER — OXYCODONE-ACETAMINOPHEN 5-325 MG PO TABS: 1.0000 | ORAL_TABLET | ORAL | Status: DC | PRN

## 2020-03-30 MED ORDER — IBUPROFEN 600 MG PO TABS
600.0000 mg | ORAL_TABLET | Freq: Four times a day (QID) | ORAL | Status: DC
  Administered 2020-03-30 – 2020-03-31 (×7): 600 mg via ORAL
  Filled 2020-03-30 (×7): qty 1

## 2020-03-30 MED ORDER — OXYCODONE-ACETAMINOPHEN 5-325 MG PO TABS: 2.0000 | ORAL_TABLET | ORAL | Status: DC | PRN

## 2020-03-30 MED ORDER — LACTATED RINGERS IV SOLN
500.0000 mL | INTRAVENOUS | Status: DC | PRN
  Administered 2020-03-30: 03:00:00 500 mL via INTRAVENOUS

## 2020-03-30 MED ORDER — DIBUCAINE (PERIANAL) 1 % EX OINT: 1.0000 "application " | TOPICAL_OINTMENT | CUTANEOUS | Status: DC | PRN

## 2020-03-30 MED ORDER — FENTANYL CITRATE (PF) 100 MCG/2ML IJ SOLN
50.0000 ug | INTRAMUSCULAR | Status: DC | PRN
  Administered 2020-03-30 (×2): 100 ug via INTRAVENOUS
  Filled 2020-03-30 (×2): qty 2

## 2020-03-30 MED ORDER — SIMETHICONE 80 MG PO CHEW: 80.0000 mg | CHEWABLE_TABLET | ORAL | Status: DC | PRN

## 2020-03-30 MED ORDER — SOD CITRATE-CITRIC ACID 500-334 MG/5ML PO SOLN: 30.0000 mL | ORAL | Status: DC | PRN

## 2020-03-30 MED ORDER — OXYTOCIN BOLUS FROM INFUSION
333.0000 mL | Freq: Once | INTRAVENOUS | Status: AC
  Administered 2020-03-30: 06:00:00 333 mL via INTRAVENOUS

## 2020-03-30 MED ORDER — WITCH HAZEL-GLYCERIN EX PADS: 1.0000 "application " | MEDICATED_PAD | CUTANEOUS | Status: DC | PRN

## 2020-03-30 MED ORDER — COCONUT OIL OIL
1.0000 | TOPICAL_OIL | Status: DC | PRN
Start: 2020-03-30 — End: 2020-04-02

## 2020-03-30 MED ORDER — PHENYLEPHRINE 40 MCG/ML (10ML) SYRINGE FOR IV PUSH (FOR BLOOD PRESSURE SUPPORT): 80.0000 ug | PREFILLED_SYRINGE | INTRAVENOUS | Status: DC | PRN

## 2020-03-30 MED ORDER — LACTATED RINGERS IV SOLN: 500.0000 mL | Freq: Once | INTRAVENOUS | Status: DC

## 2020-03-30 MED ORDER — BENZOCAINE-MENTHOL 20-0.5 % EX AERO
1.0000 | INHALATION_SPRAY | CUTANEOUS | Status: DC | PRN
Start: 2020-03-30 — End: 2020-04-02
  Administered 2020-03-30: 1 via TOPICAL
  Filled 2020-03-30: qty 56

## 2020-03-30 MED ORDER — TETANUS-DIPHTH-ACELL PERTUSSIS 5-2.5-18.5 LF-MCG/0.5 IM SUSY: 0.5000 mL | PREFILLED_SYRINGE | Freq: Once | INTRAMUSCULAR | Status: DC

## 2020-03-30 MED ORDER — ONDANSETRON HCL 4 MG PO TABS: 4.0000 mg | ORAL_TABLET | ORAL | Status: DC | PRN

## 2020-03-30 MED ORDER — ONDANSETRON HCL 4 MG/2ML IJ SOLN: 4.0000 mg | Freq: Four times a day (QID) | INTRAMUSCULAR | Status: DC | PRN

## 2020-03-30 MED ORDER — ACETAMINOPHEN 325 MG PO TABS: 650.0000 mg | ORAL_TABLET | ORAL | Status: DC | PRN

## 2020-03-30 MED ORDER — PRENATAL MULTIVITAMIN CH
1.0000 | ORAL_TABLET | Freq: Every day | ORAL | Status: DC
  Administered 2020-03-30 – 2020-04-01 (×3): 1 via ORAL
  Filled 2020-03-30 (×3): qty 1

## 2020-03-30 MED ORDER — ONDANSETRON HCL 4 MG/2ML IJ SOLN: 4.0000 mg | INTRAMUSCULAR | Status: DC | PRN

## 2020-03-30 MED ORDER — LACTATED RINGERS IV SOLN: INTRAVENOUS | Status: DC

## 2020-03-30 MED ORDER — SENNOSIDES-DOCUSATE SODIUM 8.6-50 MG PO TABS
2.0000 | ORAL_TABLET | Freq: Every day | ORAL | Status: DC
  Administered 2020-03-31: 13:00:00 2 via ORAL
  Filled 2020-03-30 (×2): qty 2

## 2020-03-30 MED ORDER — OXYTOCIN-SODIUM CHLORIDE 30-0.9 UT/500ML-% IV SOLN
2.5000 [IU]/h | INTRAVENOUS | Status: DC
  Filled 2020-03-30: qty 500

## 2020-03-30 MED ORDER — OXYCODONE HCL 5 MG PO TABS
5.0000 mg | ORAL_TABLET | ORAL | Status: DC | PRN
  Administered 2020-03-30 – 2020-04-01 (×8): 5 mg via ORAL
  Filled 2020-03-30 (×8): qty 1

## 2020-03-30 MED ORDER — DIPHENHYDRAMINE HCL 25 MG PO CAPS: 25.0000 mg | ORAL_CAPSULE | Freq: Four times a day (QID) | ORAL | Status: DC | PRN

## 2020-03-30 NOTE — Lactation Note (Signed)
This note was copied from a baby's chart. Lactation Consultation Note  Patient Name: Boy Ysabella Reddinger Today's Date: 03/30/2020 Reason for consult: Initial assessment (L&D)  P2, Baby less than 1 hour old.  [redacted]w[redacted]d.  Visit in L&D. Baby cueing, STS on mother's chest.  Hand expressed a drop and assisted mother with latching infant. Infant latched off and on.  Discussed LPI feeding behavior briefly.    Maternal Data Has patient been taught Hand Expression?: Yes Does the patient have breastfeeding experience prior to this delivery?: Yes  Feeding Feeding Type: Breast Fed  LATCH Score                   Interventions    Lactation Tools Discussed/Used     Consult Status Consult Status: Follow-up Date: 03/30/20 Follow-up type: In-patient    Dahlia Byes [facility] 03/30/2020, 7:33 AM

## 2020-03-30 NOTE — MAU Note (Signed)
Pt had ROM at 0045 and now having ctx 4-5 min apart.

## 2020-03-30 NOTE — Progress Notes (Signed)
Pt c/o 8/10 cramping unrelieved per pt with motrin and tylenol. (Last had motrin around 1800 and tylenol around 1400.) Pt requesting something stronger for pain. [facility] CNM called and verbal order received.

## 2020-03-30 NOTE — Lactation Note (Addendum)
This note was copied from a baby's chart. Lactation Consultation Note  Patient Name: Katherine Lee Today's Date: 03/30/2020 Reason for consult: Follow-up assessment;Late-preterm 34-36.6wks   P2, Baby 7 hours old.  [redacted]w[redacted]d.  Last blood sugar 40.  Reviewed hand expression with drops expressed. Baby latched off and on briefly with a tight suck pinching mother, tongue thrusting.  After a few attempts baby became sleepy.  Discussed LPI feeding behavior and mother agreed to donor milk supplementation.  Attempted with curved tip syringe and with slow flow nipple.  Baby took approx 90ml. Judeth Cornfield RN gave an additional 3 ml. Reviewed pumping q 3 hours and LPI volume guidelines. Mother has personal DEBP at home. Mom made aware of O/P services, breastfeeding support groups, community resources, and our phone # for post-discharge questions.     Maternal Data Has patient been taught Hand Expression?: Yes Does the patient have breastfeeding experience prior to this delivery?: Yes (32 week NICU infant, pumped & bottle fed)  Feeding Feeding Type: Breast Fed  LATCH Score                   Interventions Interventions: Breast feeding basics reviewed;Assisted with latch;Skin to skin;Hand express;DEBP  Lactation Tools Discussed/Used Pump Review: Setup, frequency, and cleaning Initiated by:: [place] Williams, RN Date initiated:: 03/30/20   Consult Status Consult Status: Follow-up Date: 03/31/20    Dahlia Byes [facility] 03/30/2020, 1:56 PM

## 2020-03-30 NOTE — Progress Notes (Signed)
Labor Progress Note  Katherine Lee is a 34 y.o. female, G3P0111, IUP at 36.6 weeks, presenting for preterm labor and SROM. Pt had ROM at 0045 and now having ctx 4-5 min apart.H/O preterm delivery at 31.4, HSV on valtrex, denies prodromal s/sx. Pt endorse + Fm.  Denies vaginal bleeding.    Subjective: Pt stable in pain, using IV pain meds, but not tolerating cxt well, pt declined epidural for now, also reviewed R/B/A of pitocin for Augmentin of SROM, pt declined for now, pt encouraged to get out of bed and move around to help with labor.  Patient Active Problem List   Diagnosis Date Noted   Preterm labor 03/30/2020   Objective: BP 135/66    Pulse 77    Temp 98 F (36.7 C) (Oral)    Resp 20    Ht 5\' 7"  (1.702 m)    Wt 99.1 kg    BMI 34.21 kg/m  No intake/output data recorded. No intake/output data recorded. NST: FHR baseline 130 bpm, Variability: moderate, Accelerations:present, Decelerations:  Absent= Cat 2/Reactive CTX:  irregular, every 2-6 minutes Uterus gravid, soft non tender, moderate to palpate with contractions.  SVE:  Dilation: 4 Effacement (%): 80,90 Station: -1 Exam by:: [email or web address], CNM Pitocin at [employer]) mUn/min  Assessment:  Katherine Lee is a 34 y.o. female, G3P0111, IUP at 36.6 weeks, presenting for preterm labor and SROM. Pt had ROM at 0045 and now having ctx 4-5 min apart.H/O preterm delivery at 31.4, HSV on valtrex, denies prodromal s/sx. Pt endorse + Fm.  Denies vaginal bleeding.  Pt not progressing in latent labor.  Patient Active Problem List   Diagnosis Date Noted   Preterm labor 03/30/2020   NICHD: Category 2  Position change, bolus  Membranes:  12/19 @ 0045 x SROM, clear, 5hrs, no s/s of infection  Induction:    Pitocin - Declined  Pain management:               IV pain management: x@ 0221, 0415             Epidural placement:  PRN  GBS Negative  Plan: Continue labor plan Continuous monitoring Rest Ambulate Frequent  position changes to facilitate fetal rotation and descent. Will reassess with cervical exam at 0800 or earlier if necessary Anticipate pitocin per protocol If variables do not stop plan for amnioinfusion with 1/20 bolus and 150 maintenance.  Anticipate labor progression and vaginal delivery.   , NP-C, CNM, MSN 03/30/2020. 4:48 AM

## 2020-03-30 NOTE — Lactation Note (Signed)
This note was copied from a baby's chart. Lactation Consultation Note Baby 17 hrs old. RN informed LC of baby spitting up green and being poor feeder. Could LC assist to see if baby would feed.  Mom had attempted to give DBM. Baby took 2 ml. Baby making some jerking motions while in bed. RN assessing. LC picked baby up to see if baby would take DBM. Baby chewed on nipple, could feel tongue thrusting nipple and jaw making quivering motion.  Baby took 3 ml DBM. At intervals baby would make jerking of head and once his whole body jerked then he made several facial movements, eyes fluttering, protruding tongue in and out of mouth. Baby spit up all of DBM that he took. LC showed RN what he spit up.  LC alerted RN of jerking movements. LC verified baby's ankle bracelet w/mom's before RN took to CN for monitoring.  Patient Name: Katherine Lee ASNKN'L Date: 03/30/2020 Reason for consult: Follow-up assessment;Difficult latch;Late-preterm 34-36.6wks   Maternal Data    Feeding Feeding Type: Donor Breast Milk Nipple Type: Nfant Slow Flow (purple)  LATCH Score                   Interventions    Lactation Tools Discussed/Used     Consult Status Consult Status: Follow-up Date: 03/31/20 Follow-up type: In-patient    Charyl Dancer 03/30/2020, 11:06 PM

## 2020-03-30 NOTE — H&P (Signed)
Katherine Lee is a 34 y.o. female, G3P0111, IUP at 36.6 weeks, presenting for preterm labor and SROM. Pt had ROM at 0045 and now having ctx 4-5 min apart. H/O preterm delivery at 31.4, HSV on valtrex, denies prodromal s/sx. Pt endorse + Fm.  Denies vaginal bleeding.    There are no problems to display for this patient.    Medications Prior to Admission  Medication Sig Dispense Refill Last Dose  . valACYclovir (VALTREX) 500 MG tablet Take 500 mg by mouth 2 (two) times daily.   03/29/2020 at Unknown time  . norgestimate-ethinyl estradiol (ORTHO-CYCLEN,SPRINTEC,PREVIFEM) 0.25-35 MG-MCG tablet Start by taking 2 tabs/ day x 3 days, then take 1 tab/day to finish the pack.  Take the next pack 1 tab/day as directed. 1 Package 11     Past Medical History:  Diagnosis Date  . Medical history non-contributory      No current facility-administered medications on file prior to encounter.   Current Outpatient Medications on File Prior to Encounter  Medication Sig Dispense Refill  . valACYclovir (VALTREX) 500 MG tablet Take 500 mg by mouth 2 (two) times daily.    . norgestimate-ethinyl estradiol (ORTHO-CYCLEN,SPRINTEC,PREVIFEM) 0.25-35 MG-MCG tablet Start by taking 2 tabs/ day x 3 days, then take 1 tab/day to finish the pack.  Take the next pack 1 tab/day as directed. 1 Package 11     No Known Allergies  History of present pregnancy: Pt Info/Preference:  Screening/Consents:  Labs:   EDD: Estimated Date of Delivery: 04/21/20  Establised: No LMP recorded. Patient is pregnant.  Anatomy Scan: Date: 8/16 Placenta Location: anterior Genetic Screen: Panoroma:LR AFP:  First Tri: Quad:  Office: [facility]            First PNV: 12.4 wg Blood Type  B+  Language: english Last PNV: 36.1 wg Rhogam  N/A  Flu Vaccine:  declined   Antibody  Neg  TDaP vaccine declined   GTT: Early: 5.7 Third Trimester: 108  Feeding Plan: Breast/bottle BTL: no Rubella:  Immune  Contraception: ??? VBAC: no RPR:   NR   Circumcision: ???   HBsAg:  NEG  Pediatrician:  ???   HIV:   NEg  Prenatal Classes: no Additional [place]: 11/3 growth 3.8lbs 46%, vertex, afi 14.9 GBS:  Negative 12/14(For PCN allergy, check sensitivities)       Chlamydia: neg    MFM Referral/Consult:  GC: neg  Support Person: Family member   PAP: ???  Pain Management: IV Neonatologist Referral:  Hgb Electrophoresis:  AA  Birth Plan: DCC, natural   Hgb NOB: 13.6    28W: 113.4   OB History    Gravida  3   Para  1   Term      Preterm  1   AB  1   Living  1     SAB      IAB  1   Ectopic      Multiple      Live Births             Past Medical History:  Diagnosis Date  . Medical history non-contributory    Past Surgical History:  Procedure Laterality Date  . WISDOM TOOTH EXTRACTION     Family History: family history includes Arthritis in her paternal grandfather; Hypertension in her paternal grandfather; Stroke in an other family member. Social History:  reports that she has never smoked. She has never used smokeless tobacco. She reports that she does not drink alcohol and does not  use drugs.   Prenatal Transfer Tool  Maternal Diabetes: No Genetic Screening: Normal Maternal Ultrasounds/Referrals: Normal Fetal Ultrasounds or other Referrals:  None Maternal Substance Abuse:  No Significant Maternal Medications:  None Significant Maternal Lab Results: Group B Strep negative  ROS:  Review of Systems  Constitutional: Negative.   HENT: Negative.   Eyes: Negative.   Respiratory: Negative.   Cardiovascular: Negative.   Gastrointestinal: Positive for abdominal pain.  Genitourinary: Negative.        Vaginal leakage   Musculoskeletal: Negative.   Skin: Negative.   Neurological: Negative.   Endo/Heme/Allergies: Negative.   Psychiatric/Behavioral: Negative.      Physical Exam: BP 125/81   Pulse 80   Temp 98.8 F (37.1 C) (Oral)   Resp 18   Ht 5\' 7"  (1.702 m)   Wt 99.1 kg   BMI 34.21 kg/m   Physical  Exam Vitals and nursing note reviewed. Exam conducted with a chaperone present.  HENT:     Head: Normocephalic and atraumatic.     Nose: Nose normal.     Mouth/Throat:     Mouth: Mucous membranes are moist.  Eyes:     Conjunctiva/sclera: Conjunctivae normal.     Pupils: Pupils are equal, round, and reactive to light.  Cardiovascular:     Rate and Rhythm: Normal rate and regular rhythm.     Pulses: Normal pulses.     Heart sounds: Normal heart sounds.  Pulmonary:     Effort: Pulmonary effort is normal.     Breath sounds: Normal breath sounds.  Abdominal:     General: Bowel sounds are normal.  Genitourinary:    General: Normal vulva.     Rectum: Normal.     Comments: Uterus gravida, pelvis adequate, no lesion noted.  Musculoskeletal:        General: Normal range of motion.     Cervical back: Normal range of motion and neck supple.  Skin:    General: Skin is warm.     Capillary Refill: Capillary refill takes less than 2 seconds.  Neurological:     General: No focal deficit present.     Mental Status: She is alert.  Psychiatric:        Mood and Affect: Mood normal.      NST: FHR baseline 130 bpm, Variability: moderate, Accelerations:present, Decelerations:  Absent= Cat 2/Reactive UC:   irregular, every 2-6 minutes SVE:   Dilation: 4.5 Effacement (%): 80 Station: -2 Exam by:: [email or web address] RN, vertex verified by fetal sutures.  Leopold's: Position vertex, EFW 7 via leopold's.   Labs: Results for orders placed or performed during the hospital encounter of 03/30/20 (from the past 24 hour(s))  POCT fern test     Status: Abnormal   Collection Time: 03/30/20  2:02 AM  Result Value Ref Range   POCT Fern Test Positive = ruptured amniotic membanes     Imaging:  No results found.  MAU Course: Orders Placed This Encounter  Procedures  . Resp Panel by RT-PCR (Flu A&B, Covid) Nasopharyngeal Swab  . Contraction - monitoring  . External fetal heart monitoring  . Vaginal exam   . Cervical Exam  . POCT fern test   No orders of the defined types were placed in this encounter.   Assessment/Plan: Katherine Lee is a 34 y.o. female, G3P0111, IUP at 36.6 weeks, presenting for preterm labor and SROM. Pt had ROM at 0045 and now having ctx 4-5 min apart. H/O preterm delivery at 31.4, HSV on  valtrex, denies prodromal s/sx. Pt endorse + Fm.  Denies vaginal bleeding.    FWB: Cat 2 Fetal Tracing.   Plan: Admit to Birthing Suite per consult with DR Richardson Dopp Routine [facility] orders Pain med/epidural prn Anticipate starting pitocin if no cervical change in 4 hours Anticipate labor progression   Dale Bradner NP-C, CNM, MSN 03/30/2020, 2:03 AM

## 2020-03-31 LAB — CBC
HCT: 33.3 % — ABNORMAL LOW (ref 36.0–46.0)
Hemoglobin: 10.9 g/dL — ABNORMAL LOW (ref 12.0–15.0)
MCH: 30.2 pg (ref 26.0–34.0)
MCHC: 32.7 g/dL (ref 30.0–36.0)
MCV: 92.2 fL (ref 80.0–100.0)
Platelets: 193 10*3/uL (ref 150–400)
RBC: 3.61 MIL/uL — ABNORMAL LOW (ref 3.87–5.11)
RDW: 13.9 % (ref 11.5–15.5)
WBC: 15.7 10*3/uL — ABNORMAL HIGH (ref 4.0–10.5)
nRBC: 0 % (ref 0.0–0.2)

## 2020-03-31 LAB — T.PALLIDUM AB, TOTAL: T Pallidum Abs: NONREACTIVE

## 2020-03-31 MED ORDER — DOCUSATE SODIUM 100 MG PO CAPS
200.0000 mg | ORAL_CAPSULE | Freq: Once | ORAL | Status: AC
  Administered 2020-03-31: 21:00:00 200 mg via ORAL
  Filled 2020-03-31: qty 2

## 2020-03-31 NOTE — Progress Notes (Signed)
PPD# 1 SVD w/ intact perineum Information for the patient's newborn:  Thandiwe, Siragusa [[id number]]  female    Baby Name Ayce Circumcision desires in-patient   S:   Reports feeling crampy Tolerating PO fluid and solids No nausea or vomiting Bleeding is light Pain controlled with PO meds Up ad lib / ambulatory / voiding w/o difficulty Feeding: Bottle    O:   VS: BP 116/76 (BP Location: Right Arm)   Pulse (!) 55   Temp 97.7 F (36.5 C) (Oral)   Resp 18   Ht 5\' 7"  (1.702 m)   Wt 99.1 kg   SpO2 100%   Breastfeeding Unknown   BMI 34.21 kg/m   LABS:  Recent Labs    03/30/20 0200 03/31/20 0546  WBC 15.1* 15.7*  HGB 12.3 10.9*  PLT 223 193   Blood type: --/--/B POS (12/19 0200) Rubella: Immune (06/17 0000)                      I&O: Intake/Output      12/19 0701 12/20 0700 12/20 0701 12/21 0700   I.V. (mL/kg) 0 (0)    Other 0    Total Intake(mL/kg) 0 (0)    Urine (mL/kg/hr) 250 (0.1)    Blood     Total Output 250    Net -250           Physical Exam: Alert and oriented X3 Lungs: Clear and unlabored Heart: regular rate and rhythm / no mumurs Abdomen: soft, non-tender, non-distended  Fundus: firm, non-tender Perineum: intact Lochia: appropriate Extremities: no edema, no calf pain or tenderness    A:  PPD # 1  Normal exam  P:  Routine post partum orders Anticipate D/C on 04/01/20  Plan reviewed w/ Dr. 04/03/20, MSN, CNM 03/31/2020, 10:12 AM

## 2020-03-31 NOTE — Lactation Note (Signed)
This note was copied from a baby's chart. Lactation Consultation Note  Patient Name: Boy Tashiya Barbara Today's Date: 03/31/2020 Reason for consult: Follow-up assessment  Follow up to 39 hours old LPT infant with 5.17% weight loss at the time of LC visit. Mother requests assistance with latch. Mother pumped prior to [facility] visit and collected ~14mL of EBM. LC placed 16 mm nipple shield and instilled EBM with curved tip syringe. Assisted mother with a football latch to right breast. Provided support with pillows. Infant pops on and off breast, observed tongue sucking and thrusting as well upper lip curling. Infant able to latch after a couple of attempts. Noted effective EBM transfer from NS. Infant stops suckling and holds nipple in mouth. Several attempts made but reinforced importance of preserving energy due to infant's age.  Mother provided 23 mL of donor milk via bottle pacing and burping with frequency. Infant seems content.  Promoted maternal rest, hydration and food intake. Encouraged to contact [facility] for support when ready to breastfeed baby and recommended to request help for questions or concerns.    Plan: 1-HE colostrum before latching infant to be given after breastfeeding.  2-Breastfeed using NS, no longer than 30 minutes 3-Spoon or fingerfeed EBM 4-Pump using initiation setting 5-Supplement with donor milk as needed following LPTI guidelines..  All questions answered at this time. Praised mother for dedication and milk supply.    Maternal Data Formula Feeding for Exclusion: No  Feeding Feeding Type: Breast Fed Nipple Type: Nfant Slow Flow (purple)  LATCH Score Latch: Repeated attempts needed to sustain latch, nipple held in mouth throughout feeding, stimulation needed to elicit sucking reflex.  Audible Swallowing: A few with stimulation  Type of Nipple: Everted at rest and after stimulation  Comfort (Breast/Nipple): Filling, red/small blisters or bruises, mild/mod  discomfort  Hold (Positioning): Assistance needed to correctly position infant at breast and maintain latch.  LATCH Score: 6  Interventions Interventions: Skin to skin;Breast massage;Hand express;Adjust position;Support pillows;Position options;Assisted with latch;Expressed milk;DEBP  Lactation Tools Discussed/Used Tools: Pump;Flanges;Nipple Dorris Carnes;Bottle Nipple shield size: 16 Flange Size: 24 Breast pump type: Double-Electric Breast Pump   Consult Status Consult Status: Follow-up Date: 04/01/20 Follow-up type: In-patient    Kanaya Gunnarson A Higuera Ancidey 03/31/2020, 9:12 PM

## 2020-03-31 NOTE — Lactation Note (Signed)
This note was copied from a baby's chart. Lactation Consultation Note  Patient Name: Boy Alaya Frech Today's Date: 03/31/2020 Reason for consult: Follow-up assessment;Mother's request;Difficult latch;Early term 37-38.6wks;Infant weight loss  Infant is 36 weeks and 36  hours old LPTI. Infant last 2 feedings of formula 20-22 ml. Infant had small amount of emesis following last feeding.   Upon arrival, infant in Mom's arms sleeping. Last feeding 1 hour prior to visit.  LC reviewed with Mom feeding positions at the breast and with paced bottle feeding. Mom's breasts showed no signs of nipple trauma.    Plan 1. Mom to feed based on cues 8-12 x in 24 hour period no more than 3 hours without attempt. Mom to following feeding plan established by Pediatrician.          2. Mom to pump using DEBP q 3 hours for 15 minutes.          3. Mom to call for assistance with latching before next feeding.     Altair Appenzeller  Nicholson-Springer 03/31/2020, 6:55 PM

## 2020-04-01 ENCOUNTER — Ambulatory Visit: Payer: Self-pay

## 2020-04-01 LAB — URINALYSIS, ROUTINE W REFLEX MICROSCOPIC
Bilirubin Urine: NEGATIVE
Glucose, UA: NEGATIVE mg/dL
Ketones, ur: NEGATIVE mg/dL
Nitrite: NEGATIVE
Protein, ur: NEGATIVE mg/dL
Specific Gravity, Urine: 1.009 (ref 1.005–1.030)
pH: 6 (ref 5.0–8.0)

## 2020-04-01 MED ORDER — IBUPROFEN 800 MG PO TABS: 800.0000 mg | ORAL_TABLET | Freq: Three times a day (TID) | ORAL | 0 refills | Status: AC

## 2020-04-01 MED ORDER — ACETAMINOPHEN 500 MG PO TABS: 1000.0000 mg | ORAL_TABLET | Freq: Four times a day (QID) | ORAL | 0 refills | Status: AC | PRN

## 2020-04-01 MED ORDER — ACETAMINOPHEN 500 MG PO TABS
1000.0000 mg | ORAL_TABLET | Freq: Four times a day (QID) | ORAL | Status: DC | PRN
  Administered 2020-04-01 (×2): 1000 mg via ORAL
  Filled 2020-04-01 (×2): qty 2

## 2020-04-01 MED ORDER — IBUPROFEN 800 MG PO TABS
800.0000 mg | ORAL_TABLET | Freq: Three times a day (TID) | ORAL | Status: DC
  Administered 2020-04-01 (×2): 800 mg via ORAL
  Filled 2020-04-01 (×2): qty 1

## 2020-04-01 NOTE — Discharge Summary (Signed)
   Postpartum Discharge Summary  Date of Service updated2/19/2021     Patient Name: Katherine Lee DOB: 07/07/1985 MRN: [id number]  Date of admission: 03/30/2020 Delivery date:03/30/2020  Delivering provider: MONTANA, JADE  Date of discharge: 04/01/2020  Admitting diagnosis: Preterm labor [O60.00] Intrauterine pregnancy: [redacted]w[redacted]d     Secondary diagnosis:  Active Problems:   Preterm labor   SVD (12/19)  Additional problems: back pain    Discharge diagnosis: Term Pregnancy Delivered                                              Post partum procedures:None Augmentation: Pitocin Complications: None  Hospital course: Onset of Labor With Vaginal Delivery      34 y.o. yo G3P0212 at [redacted]w[redacted]d was admitted in Latent Labor on 03/30/2020. Patient had an uncomplicated labor course as follows:  Membrane Rupture Time/Date: 12:45 AM ,03/30/2020   Delivery Method:Vaginal, Spontaneous  Episiotomy: None  Lacerations:  None  Patient had an uncomplicated postpartum course.  She is ambulating, tolerating a regular diet, passing flatus, and urinating well. Patient is discharged home in stable condition on 04/01/20.  Newborn Data: Birth date:03/30/2020  Birth time:6:01 AM  Gender:Female  Living status:Living  Apgars:8 ,9  Weight:2784 g   Magnesium Sulfate received: No BMZ received: No Rhophylac:No MMR:No T-DaP:declined Flu: No Transfusion:No  Physical exam  Vitals:   03/31/20 0541 03/31/20 1618 04/01/20 0515 04/01/20 1404  BP: 116/76 121/74 116/80 117/78  Pulse: (!) 55 62 78 66  Resp: 18  16 17  Temp: 97.7 F (36.5 C) 98.9 F (37.2 C) 98.6 F (37 C) 98.3 F (36.8 C)  TempSrc: Oral  Oral Oral  SpO2:   100% 98%  Weight:      Height:       General: alert, cooperative and no distress Lochia: appropriate Uterine Fundus: firm Incision: N/A DVT Evaluation: No evidence of DVT seen on physical exam. Labs: Lab Results  Component Value Date   WBC 15.7 (H) 03/31/2020   HGB 10.9  (L) 03/31/2020   HCT 33.3 (L) 03/31/2020   MCV 92.2 03/31/2020   PLT 193 03/31/2020   CMP Latest Ref Rng & Units 04/14/2012  Glucose 70 - 99 mg/dL 105(H)  BUN 6 - 23 mg/dL 8  Creatinine 0.50 - 1.10 mg/dL 0.90  Sodium 135 - 145 mEq/L 140  Potassium 3.5 - 5.1 mEq/L 3.7  Chloride 96 - 112 mEq/L 103   Edinburgh Score: Edinburgh Postnatal Depression Scale Screening Tool 04/01/2020  I have been able to laugh and see the funny side of things. 0  I have looked forward with enjoyment to things. 0  I have blamed myself unnecessarily when things went wrong. 1  I have been anxious or worried for no good reason. 2  I have felt scared or panicky for no good reason. 0  Things have been getting on top of me. 1  I have been so unhappy that I have had difficulty sleeping. 1  I have felt sad or miserable. 0  I have been so unhappy that I have been crying. 1  The thought of harming myself has occurred to me. 0  Edinburgh Postnatal Depression Scale Total 6      After visit meds:  Allergies as of 04/01/2020   No Known Allergies     Medication List    STOP taking these medications     norgestimate-ethinyl estradiol 0.25-35 MG-MCG tablet Commonly known as: ORTHO-CYCLEN     TAKE these medications   acetaminophen 500 MG tablet Commonly known as: TYLENOL Take 2 tablets (1,000 mg total) by mouth every 6 (six) hours as needed for moderate pain, headache or fever (for pain scale < 4).   ibuprofen 800 MG tablet Commonly known as: ADVIL Take 1 tablet (800 mg total) by mouth every 8 (eight) hours.   valACYclovir 500 MG tablet Commonly known as: VALTREX Take 500 mg by mouth 2 (two) times daily.        Discharge home in stable condition Infant Feeding: Bottle and Breast Infant Disposition:home with mother Discharge instruction: per After Visit Summary and Postpartum booklet. Activity: Advance as tolerated. Pelvic rest for 6 weeks.  Diet: routine diet Anticipated Birth Control:  Unsure Postpartum Appointment:6 weeks Additional Postpartum F/U: None Future Appointments:No future appointments. Follow up Visit:  [facility] Follow up in 6 week(s).   Specialty: Obstetrics and Gynecology Contact information: [address] [place] [address] [phone]                  04/01/2020 Starla Link, CNM

## 2020-04-01 NOTE — Lactation Note (Signed)
This note was copied from a baby's chart. Lactation Consultation Note  Patient Name: Katherine Lee Today's Date: 04/01/2020 Reason for consult: Follow-up assessment;Infant weight loss;Other (Comment) (7% loss. baby awake in the crib . mom to call after she uses the bathroom for Latch assist  and resizing of the NS)   Maternal Data    Feeding    LATCH Score                   Interventions Interventions: Breast feeding basics reviewed  Lactation Tools Discussed/Used     Consult Status Consult Status: Follow-up Date: 04/01/20 Follow-up type: In-patient    Katherine Lee 04/01/2020, 8:28 AM

## 2020-04-01 NOTE — Progress Notes (Signed)
Pt resting at 0200 and 0515 when I rounded. Pt knows about the UA and how it needs to be collected but she has not provided specimen yet. I will pass this a long to dayshift.

## 2020-04-01 NOTE — Progress Notes (Signed)
Called by RN to address pt's continued c/o of abd pain and poor response to Ibuprofen 600mg , Tylenol 650 mg, and Oxycodone IR 5mg .   S: C/O pain in abd when pumping, lower right flank pain, and fatigue. Has not slept since birth. Ambulating independently and caring for self and infant. No support in room since birth.   O: Gen: AAO x3 Resp: unlabored  Abd: soft, non-tender, non-distended, U-2, firm, midline Lochia: appropriate Perineum: intact, no edema, no s/s hematoma Ext: no edema Blood pressure 121/74, pulse 62, temperature 98.9 F (37.2 C), resp. rate 18, height 5\' 7"  (1.702 m), weight 99.1 kg, SpO2 100 %, unknown if currently breastfeeding.   A/P: PP Day 2 Abd and back pain    -Increase Ibuprofen from 600-800mg  TID    -Increase Tylenol from [phone] mg TID PRN R/O UTI    -send UA Continue K-pad for comfort  , MSN, CNM 04/01/2020 1:00 AM

## 2020-04-01 NOTE — Lactation Note (Signed)
This note was copied from a baby's chart. Lactation Consultation Note Attempted to see baby at 40 hrs old. Mom and baby sleeping. Will attempt again this shift.  Patient Name: Katherine Lee Today's Date: 04/01/2020     Maternal Data    Feeding    LATCH Score                   Interventions    Lactation Tools Discussed/Used     Consult Status      Charyl Dancer 04/01/2020, 2:28 AM

## 2020-04-01 NOTE — Discharge Instructions (Signed)

## 2020-04-01 NOTE — Lactation Note (Signed)
This note was copied from a baby's chart. Lactation Consultation Note  Patient Name: Katherine Lee Today's Date: 04/01/2020 Reason for consult: Follow-up assessment;Infant weight loss;Other (Comment);Late-preterm 34-36.6wks;Difficult latch (7 % weight loss) Baby is 50 hours old , 10.3 @ 47 Bili - check - Low intermediate.  Baby awake, mom receptive to latching and having the NS checked for size.  LC resized mom for #20 NS and it was a good fit. LC instilled EBM into the top and after several attempts baby opened wide enough to latch with depth.  Baby sustained latch for about 7 mins and then still latch and not suckling.  Mom released the latch and fed the baby the donor milk from a bottle with  ( green ) and baby tolerated well.  No spitting noted.  Per mom the base of the nipple tender. LC assessed and not breakdown noted.  LC provided comfort gels for after feedings or pumping and alternating with shells while awake.  Sore nipple and engorgement prevention and tx reviewed. Per  Mom will have a DEBP Medela at home.  LC offered to place a request for [facility] O/P appt for next week and mom receptive.     Maternal Data    Feeding Feeding Type: Breast Milk  LATCH Score Latch: Repeated attempts needed to sustain latch, nipple held in mouth throughout feeding, stimulation needed to elicit sucking reflex.  Audible Swallowing: Spontaneous and intermittent  Type of Nipple: Everted at rest and after stimulation  Comfort (Breast/Nipple): Filling, red/small blisters or bruises, mild/mod discomfort  Hold (Positioning): Assistance needed to correctly position infant at breast and maintain latch.  LATCH Score: 7  Interventions Interventions: Breast feeding basics reviewed;Assisted with latch;Skin to skin;Breast massage;Adjust position;Support pillows;Position options  Lactation Tools Discussed/Used Nipple shield size: 20 Flange Size: 24   Consult Status Consult Status:  Follow-up Date: 04/01/20 Follow-up type: In-patient    Katherine Lee 04/01/2020, 8:56 AM

## 2020-04-01 NOTE — Lactation Note (Signed)
This note was copied from a baby's chart. Lactation Consultation Note Baby 56 hrs old. Mom sitting up in bed using DEBP. Collecting a good amount of transitional milk. Praised mom. Asked mom how was BF and feeding baby going. Mom stated he was doing better at taking milk, not spitting as much up but still some. Stated Dr. York Spaniel it wasn't anything to worry about. Mom stated baby will not latch. Mom is only giving 15 ml of milk. Encouraged mom to increase amount giving. Give some then wait and give more before hour is up. Encouraged mom to call for [facility] for next feeding and I would assist in latching baby. Mom stated OK. Milk storage reviewed.  Patient Name: Boy Lajuanda Stepp [id number] Date: 04/01/2020 Reason for consult: Follow-up assessment;Early term 37-38.6wks   Maternal Data    Feeding    LATCH Score                   Interventions Interventions: DEBP  Lactation Tools Discussed/Used     Consult Status Consult Status: Follow-up Date: 04/02/20 Follow-up type: In-patient    Charyl Dancer 04/01/2020, 9:49 PM

## 2020-04-02 ENCOUNTER — Ambulatory Visit: Payer: Self-pay

## 2020-04-02 NOTE — Lactation Note (Signed)
This note was copied from a baby's chart. Lactation Consultation Note  Patient Name: Katherine Lee Today's Date: 04/02/2020   Mom will call for me to return to assess her nipples, which Mom says are sore. Mom is currently bottle-feeding infant her EBM with the Enfamil slow-flow nipple (which infant is doing well with). RN had previously informed her to increase infant's volumes.  I informed Mom that infant may not do well at the breast until closer to her EDD. She has an outpatient Lactation appt for next week, but she may consider rescheduling it to closer to her EDD since infant is not latching much at the breast.   Mom has a Medela DEBP at home.   Lurline Hare [facility] 04/02/2020, 11:13 AM

## 2020-04-02 NOTE — Lactation Note (Signed)
This note was copied from a baby's chart. Lactation Consultation Note  Patient Name: Boy Kaneshia Martucci Today's Date: 04/02/2020   I noted that Mom had a small crack at the base of her L nipple. Mom has been using size 24 flanges with pumping. I suggested that Mom use a size 27 flange with the L breast & a size 24 for the R breast. Mom did so & found it to be comfortable. She was able to express about 2 oz.   Lactation student, [place] Planas, taught Mom how to do hand expression and how to use coconut oil on her nipples. Mom is also aware that she can use coconut oil to lightly lubricate the flanges. The lactation student taught her how to use the manual pump and Mom already knew how to wash her pump parts.   Mom had no additional questions for [place]. She knows to feed infant more than 30 mL if infant is cueing for more. Mom knows to pump for 8+ more pumping sessions/day (I asked Mom not to go more than 4 hrs between pumping sessions). Mom knows how to reach [place] for post-discharge questions.   Lurline Hare [facility] 04/02/2020, 12:47 PM

## 2020-10-02 DIAGNOSIS — F3341 Major depressive disorder, recurrent, in partial remission: Secondary | ICD-10-CM | POA: Insufficient documentation

## 2020-10-02 DIAGNOSIS — F411 Generalized anxiety disorder: Secondary | ICD-10-CM | POA: Insufficient documentation

## 2021-04-29 DIAGNOSIS — Z8751 Personal history of pre-term labor: Secondary | ICD-10-CM | POA: Insufficient documentation

## 2021-06-04 DIAGNOSIS — Z332 Encounter for elective termination of pregnancy: Secondary | ICD-10-CM | POA: Insufficient documentation

## 2022-10-16 ENCOUNTER — Ambulatory Visit
Admission: EM | Admit: 2022-10-16 | Discharge: 2022-10-16 | Disposition: A | Discharge status: Home / Self Care | Payer: Medicaid Other | Attending: Internal Medicine | Admitting: Internal Medicine

## 2022-10-16 DIAGNOSIS — J029 Acute pharyngitis, unspecified: Secondary | ICD-10-CM | POA: Insufficient documentation

## 2022-10-16 LAB — POCT RAPID STREP A (OFFICE): Rapid Strep A Screen: NEGATIVE

## 2022-10-16 NOTE — ED Provider Notes (Signed)
[facility]    CSN: [id number] Arrival date & time: 10/16/22  1022      History   Chief Complaint Chief Complaint  Patient presents with   Sore Throat    Entered by patient    HPI Katherine Lee is a 37 y.o. femalecomes to the urgent care with 2-day history of sore throat, pain on swallowing and generalized bodyaches.  Patient symptoms started fairly abruptly and has been persistent.  She denies any nausea, vomiting or diarrhea.  She denies having a cough.  Patient's chart has runny nose and cough.  Patient is not vaccinated against COVID-19 virus.  She has not been diagnosed with COVID-19 before.  Patient has no medical problems.  She does not smoke.  No shortness of breath, wheezing or chest pain.   HPI  Past Medical History:  Diagnosis Date   Medical history non-contributory     Patient Active Problem List   Diagnosis Date Noted   Abortion in first trimester 06/04/2021   History of premature delivery 04/29/2021   GAD (generalized anxiety disorder) 10/02/2020   Recurrent major depressive disorder, in partial remission (HCC) 10/02/2020   Preterm labor 03/30/2020   SVD (12/19) 03/30/2020   Genital herpes simplex 10/23/2019   Pyelonephritis 10/23/2019   Pregnancy 10/02/2019   Herpetic vulvovaginitis 05/19/2018   Premature delivery 09/25/2010    Past Surgical History:  Procedure Laterality Date   WISDOM TOOTH EXTRACTION      OB History     Gravida  3   Para  2   Term      Preterm  2   AB  1   Living  2      SAB      IAB  1   Ectopic      Multiple  0   Live Births  1            Home Medications    Prior to Admission medications   Medication Sig Start Date End Date Taking? Authorizing Provider  cephALEXin (KEFLEX) 500 MG capsule Take 500 mg by mouth 3 (three) times daily. 05/11/22  Yes [provider]  methocarbamol (ROBAXIN) 500 MG tablet Take 500 mg by mouth every 8 (eight) hours as needed for muscle  spasms. 02/08/22  Yes [provider]  acetaminophen (TYLENOL) 500 MG tablet Take 2 tablets (1,000 mg total) by mouth every 6 (six) hours as needed for moderate pain, headache or fever (for pain scale < 4). 04/01/20   Prothero, Henderson Newcomer, CNM  ibuprofen (ADVIL) 800 MG tablet Take 1 tablet (800 mg total) by mouth every 8 (eight) hours. 04/01/20   Prothero, Henderson Newcomer, CNM  sertraline (ZOLOFT) 50 MG tablet Take 50 mg by mouth daily.    [provider]  valACYclovir (VALTREX) 500 MG tablet Take 500 mg by mouth 2 (two) times daily.    [provider]    Family History Family History  Problem Relation Age of Onset   Hypertension Paternal Grandfather    Arthritis Paternal Grandfather    Stroke Other     Social History Social History   Tobacco Use   Smoking status: Never    Passive exposure: Never   Smokeless tobacco: Never  Vaping Use   Vaping Use: Never used  Substance Use Topics   Alcohol use: No   Drug use: No     Allergies   Patient has no known allergies.   Review of Systems Review of Systems As  per HPI  Physical Exam Triage Vital Signs ED Triage Vitals  Enc Vitals Group     BP 10/16/22 1030 (!) 151/89     Pulse Rate 10/16/22 1030 81     Resp 10/16/22 1030 18     Temp 10/16/22 1030 98.1 F (36.7 C)     Temp Source 10/16/22 1030 Oral     SpO2 10/16/22 1030 99 %     Weight 10/16/22 1028 180 lb (81.6 kg)     Height 10/16/22 1028 5\' 7"  (1.702 m)     Head Circumference --      Peak Flow --      Pain Score 10/16/22 1026 8     Pain Loc --      Pain Edu? --      Excl. in GC? --    No data found.  Updated Vital Signs BP (!) 147/90 (BP Location: Left Arm)   Pulse 81   Temp 98.1 F (36.7 C) (Oral)   Resp 18   Ht 5\' 7"  (1.702 m)   Wt 81.6 kg   LMP 09/21/2022 (Exact Date)   SpO2 99%   BMI 28.19 kg/m   Visual Acuity Right Eye Distance:   Left Eye Distance:   Bilateral Distance:    Right Eye Near:   Left Eye Near:     Bilateral Near:     Physical Exam Vitals and nursing note reviewed.  Constitutional:      General: She is not in acute distress.    Appearance: She is not ill-appearing.  HENT:     Right Ear: Tympanic membrane normal.     Left Ear: Tympanic membrane normal.     Mouth/Throat:     Mouth: Mucous membranes are moist.     Pharynx: Uvula midline. Posterior oropharyngeal erythema present. No oropharyngeal exudate.  Cardiovascular:     Rate and Rhythm: Normal rate and regular rhythm.  Pulmonary:     Effort: Pulmonary effort is normal.     Breath sounds: Normal breath sounds.  Abdominal:     General: Bowel sounds are normal.     Palpations: Abdomen is soft.  Neurological:     Mental Status: She is alert.      UC Treatments / Results  Labs (all labs ordered are listed, but only abnormal results are displayed) Labs Reviewed  CULTURE, GROUP A STREP [facility])  POCT RAPID STREP A (OFFICE)    EKG   Radiology No results found.  Procedures Procedures (including critical care time)  Medications Ordered in UC Medications - No data to display  Initial Impression / Assessment and Plan / UC Course  I have reviewed the triage vital signs and the nursing notes.  Pertinent labs & imaging results that were available during my care of the patient were reviewed by me and considered in my medical decision making (see chart for details).     1.  Acute viral pharyngitis: Warm salt water gargle Point-of-care strep test is negative Throat cultures have been sent Patient is advised to continue warm salt water gargle Ibuprofen or Tylenol as needed for pain and/or fever Return precautions given. Final Clinical Impressions(s) / UC Diagnoses   Final diagnoses:  Acute viral pharyngitis     Discharge Instructions      Continue warm salt water gargle Take ibuprofen as needed for pain and/or fever Maintain adequate hydration Will call you with recommendations if labs are abnormal Your  strep test is negative Return to urgent care if  you have worsening symptoms.   ED Prescriptions   None    PDMP not reviewed this encounter.   Merrilee Jansky, MD 10/16/22 1055

## 2022-10-16 NOTE — Discharge Instructions (Signed)
Continue warm salt water gargle Take ibuprofen as needed for pain and/or fever Maintain adequate hydration Will call you with recommendations if labs are abnormal Your strep test is negative Return to urgent care if you have worsening symptoms.

## 2022-10-16 NOTE — ED Triage Notes (Signed)
Here for "sore throat". I believe it is strep throat. No fever. No rash. Symptoms began "10-14-2022". "Spots in throat".

## 2022-10-19 LAB — CULTURE, GROUP A STREP (THRC)
